# Patient Record
Sex: Female | Born: 1990 | Race: White | Hispanic: No | Marital: Single | Smoking: Current every day smoker
Health system: Southern US, Community
[De-identification: ages and names within clinical notes are randomized; demographics above are authoritative.]

## PROBLEM LIST (undated history)

## (undated) DIAGNOSIS — F1010 Alcohol abuse, uncomplicated: Secondary | ICD-10-CM

## (undated) DIAGNOSIS — N200 Calculus of kidney: Secondary | ICD-10-CM

## (undated) DIAGNOSIS — F329 Major depressive disorder, single episode, unspecified: Secondary | ICD-10-CM

## (undated) DIAGNOSIS — R45851 Suicidal ideations: Secondary | ICD-10-CM

## (undated) DIAGNOSIS — K5792 Diverticulitis of intestine, part unspecified, without perforation or abscess without bleeding: Secondary | ICD-10-CM

## (undated) DIAGNOSIS — F32A Depression, unspecified: Secondary | ICD-10-CM

## (undated) DIAGNOSIS — R4689 Other symptoms and signs involving appearance and behavior: Secondary | ICD-10-CM

## (undated) HISTORY — PX: WISDOM TOOTH EXTRACTION: SHX21

## (undated) HISTORY — PX: TONSILLECTOMY: SUR1361

---
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
## 2011-03-10 LAB — HEPATIC FUNCTION PANEL
ALT: 17 U/L (ref 0–35)
Alkaline Phosphatase: 48 U/L (ref 47–119)
Bilirubin, Direct: 0.1 mg/dL (ref 0.0–0.3)
Indirect Bilirubin: 0.4 mg/dL (ref 0.3–0.9)
Total Protein: 6.9 g/dL (ref 6.0–8.3)

## 2011-03-10 LAB — COMPREHENSIVE METABOLIC PANEL
BUN: 7 mg/dL (ref 6–23)
CO2: 27 mEq/L (ref 19–32)
Calcium: 9.2 mg/dL (ref 8.4–10.5)
Chloride: 102 mEq/L (ref 96–112)
Creatinine, Ser: 0.48 mg/dL (ref 0.4–1.2)
Total Bilirubin: 0.9 mg/dL (ref 0.3–1.2)

## 2011-03-10 LAB — DIFFERENTIAL
Lymphocytes Relative: 32 % (ref 24–48)
Lymphs Abs: 3.1 10*3/uL (ref 1.1–4.8)
Monocytes Absolute: 1.1 10*3/uL (ref 0.2–1.2)
Monocytes Relative: 12 % — ABNORMAL HIGH (ref 3–11)
Neutro Abs: 5.4 10*3/uL (ref 1.7–8.0)
Neutrophils Relative %: 56 % (ref 43–71)

## 2011-03-10 LAB — CBC
Hemoglobin: 12.4 g/dL (ref 12.0–16.0)
RBC: 3.86 MIL/uL (ref 3.80–5.70)
WBC: 9.6 10*3/uL (ref 4.5–13.5)

## 2011-03-10 LAB — T4, FREE: Free T4: 0.87 ng/dL (ref 0.80–1.80)

## 2011-03-10 LAB — RPR: RPR Ser Ql: NONREACTIVE

## 2011-03-10 LAB — GC/CHLAMYDIA PROBE AMP, URINE: Chlamydia, Swab/Urine, PCR: NEGATIVE
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
## 2011-05-10 ENCOUNTER — Emergency Department (HOSPITAL_COMMUNITY): Payer: Medicaid Other

## 2011-05-10 ENCOUNTER — Emergency Department (HOSPITAL_COMMUNITY)
Admission: EM | Admit: 2011-05-10 | Discharge: 2011-05-10 | Disposition: A | Discharge status: Home / Self Care | Payer: Medicaid Other | Attending: Emergency Medicine | Admitting: Emergency Medicine

## 2011-05-10 DIAGNOSIS — R109 Unspecified abdominal pain: Secondary | ICD-10-CM | POA: Insufficient documentation

## 2011-05-10 DIAGNOSIS — N200 Calculus of kidney: Secondary | ICD-10-CM | POA: Insufficient documentation

## 2011-05-10 LAB — URINALYSIS, ROUTINE W REFLEX MICROSCOPIC
Glucose, UA: NEGATIVE mg/dL
Leukocytes, UA: NEGATIVE
Protein, ur: NEGATIVE mg/dL
Specific Gravity, Urine: >1.03 — ABNORMAL HIGH (ref 1.005–1.030)
pH: 5.5 (ref 5.0–8.0)

## 2011-05-10 LAB — BASIC METABOLIC PANEL
BUN: 7 mg/dL (ref 6–23)
Creatinine, Ser: 0.47 mg/dL (ref 0.4–1.2)
GFR calc non Af Amer: >60 mL/min (ref >60)
Glucose, Bld: 90 mg/dL (ref 70–99)
Potassium: 3.8 mEq/L (ref 3.5–5.1)

## 2011-05-10 LAB — URINE MICROSCOPIC-ADD ON

## 2011-06-21 ENCOUNTER — Emergency Department: Payer: Self-pay | Admitting: Emergency Medicine

## 2011-07-29 ENCOUNTER — Emergency Department: Payer: Self-pay | Admitting: Emergency Medicine

## 2012-02-18 ENCOUNTER — Emergency Department: Payer: Self-pay | Admitting: Emergency Medicine

## 2012-02-18 LAB — CBC
HGB: 14 g/dL (ref 12.0–16.0)
RBC: 4.35 10*6/uL (ref 3.80–5.20)

## 2012-07-01 ENCOUNTER — Emergency Department: Payer: Self-pay | Admitting: Emergency Medicine

## 2012-07-01 LAB — CBC WITH DIFFERENTIAL/PLATELET
Basophil #: 0.1 10*3/uL (ref 0.0–0.1)
Eosinophil #: 0.1 10*3/uL (ref 0.0–0.7)
Lymphocyte %: 37.4 %
Monocyte %: 11.2 %
Neutrophil %: 50.2 %
Platelet: 416 10*3/uL (ref 150–440)
RBC: 4.06 10*6/uL (ref 3.80–5.20)
RDW: 12.7 % (ref 11.5–14.5)
WBC: 8.8 10*3/uL (ref 3.6–11.0)

## 2012-07-01 LAB — BASIC METABOLIC PANEL
Anion Gap: 5 — ABNORMAL LOW (ref 7–16)
Calcium, Total: 9.2 mg/dL (ref 8.5–10.1)
Co2: 26 mmol/L (ref 21–32)
Creatinine: 0.63 mg/dL (ref 0.60–1.30)
EGFR (African American): >60

## 2012-07-01 LAB — URINALYSIS, COMPLETE
Bacteria: NONE SEEN
Glucose,UR: NEGATIVE mg/dL (ref 0–75)
Nitrite: NEGATIVE
Specific Gravity: 1.014 (ref 1.003–1.030)
Squamous Epithelial: 7

## 2012-09-26 ENCOUNTER — Encounter (HOSPITAL_COMMUNITY): Payer: Self-pay | Admitting: *Deleted

## 2012-09-26 ENCOUNTER — Emergency Department (HOSPITAL_COMMUNITY)
Admission: EM | Admit: 2012-09-26 | Discharge: 2012-09-26 | Disposition: A | Discharge status: Home / Self Care | Payer: Medicaid Other | Attending: Emergency Medicine | Admitting: Emergency Medicine

## 2012-09-26 ENCOUNTER — Emergency Department (HOSPITAL_COMMUNITY): Payer: Medicaid Other

## 2012-09-26 DIAGNOSIS — J40 Bronchitis, not specified as acute or chronic: Secondary | ICD-10-CM

## 2012-09-26 DIAGNOSIS — J209 Acute bronchitis, unspecified: Secondary | ICD-10-CM | POA: Insufficient documentation

## 2012-09-26 DIAGNOSIS — F411 Generalized anxiety disorder: Secondary | ICD-10-CM | POA: Insufficient documentation

## 2012-09-26 DIAGNOSIS — F17200 Nicotine dependence, unspecified, uncomplicated: Secondary | ICD-10-CM | POA: Insufficient documentation

## 2012-09-26 DIAGNOSIS — Z9889 Other specified postprocedural states: Secondary | ICD-10-CM | POA: Insufficient documentation

## 2012-09-26 DIAGNOSIS — R000 Tachycardia, unspecified: Secondary | ICD-10-CM | POA: Insufficient documentation

## 2012-09-26 DIAGNOSIS — Z87442 Personal history of urinary calculi: Secondary | ICD-10-CM | POA: Insufficient documentation

## 2012-09-26 DIAGNOSIS — N23 Unspecified renal colic: Secondary | ICD-10-CM | POA: Insufficient documentation

## 2012-09-26 DIAGNOSIS — R110 Nausea: Secondary | ICD-10-CM | POA: Insufficient documentation

## 2012-09-26 HISTORY — DX: Calculus of kidney: N20.0

## 2012-09-26 LAB — URINALYSIS, ROUTINE W REFLEX MICROSCOPIC
Bilirubin Urine: NEGATIVE
Glucose, UA: NEGATIVE mg/dL
Nitrite: NEGATIVE
pH: 5.5 (ref 5.0–8.0)

## 2012-09-26 LAB — CBC WITH DIFFERENTIAL/PLATELET
Eosinophils Absolute: 0.1 10*3/uL (ref 0.0–0.7)
Eosinophils Relative: 0 % (ref 0–5)
Hemoglobin: 13.5 g/dL (ref 12.0–15.0)
Lymphs Abs: 4.5 10*3/uL — ABNORMAL HIGH (ref 0.7–4.0)
MCH: 30.6 pg (ref 26.0–34.0)
MCV: 88.2 fL (ref 78.0–100.0)
Monocytes Relative: 10 % (ref 3–12)
RBC: 4.41 MIL/uL (ref 3.87–5.11)

## 2012-09-26 LAB — WET PREP, GENITAL
Trich, Wet Prep: NONE SEEN
Yeast Wet Prep HPF POC: NONE SEEN

## 2012-09-26 LAB — BASIC METABOLIC PANEL
BUN: 11 mg/dL (ref 6–23)
Calcium: 10.2 mg/dL (ref 8.4–10.5)
GFR calc non Af Amer: >90 mL/min (ref >90)
Glucose, Bld: 83 mg/dL (ref 70–99)

## 2012-09-26 MED ORDER — OXYCODONE-ACETAMINOPHEN 5-325 MG PO TABS: 2.0000 | ORAL_TABLET | ORAL | Status: DC | PRN

## 2012-09-26 MED ORDER — HYDROMORPHONE HCL PF 1 MG/ML IJ SOLN
1.0000 mg | Freq: Once | INTRAMUSCULAR | Status: AC
  Administered 2012-09-26: 1 mg via INTRAVENOUS
  Filled 2012-09-26: qty 1

## 2012-09-26 MED ORDER — SODIUM CHLORIDE 0.9 % IV BOLUS (SEPSIS)
1000.0000 mL | Freq: Once | INTRAVENOUS | Status: AC
  Administered 2012-09-26: 1000 mL via INTRAVENOUS

## 2012-09-26 MED ORDER — ONDANSETRON HCL 4 MG PO TABS: 4.0000 mg | ORAL_TABLET | Freq: Four times a day (QID) | ORAL | Status: DC

## 2012-09-26 MED ORDER — KETOROLAC TROMETHAMINE 30 MG/ML IJ SOLN
30.0000 mg | Freq: Once | INTRAMUSCULAR | Status: AC
  Administered 2012-09-26: 30 mg via INTRAVENOUS
  Filled 2012-09-26: qty 1

## 2012-09-26 MED ORDER — ALBUTEROL SULFATE HFA 108 (90 BASE) MCG/ACT IN AERS: 2.0000 | INHALATION_SPRAY | RESPIRATORY_TRACT | Status: DC | PRN

## 2012-09-26 MED ORDER — ONDANSETRON HCL 4 MG/2ML IJ SOLN
4.0000 mg | Freq: Once | INTRAMUSCULAR | Status: AC
  Administered 2012-09-26: 4 mg via INTRAVENOUS
  Filled 2012-09-26: qty 2

## 2012-09-26 MED ORDER — IBUPROFEN 800 MG PO TABS: 800.0000 mg | ORAL_TABLET | Freq: Three times a day (TID) | ORAL | Status: DC

## 2012-09-26 MED ORDER — AZITHROMYCIN 250 MG PO TABS: ORAL_TABLET | ORAL | Status: DC

## 2012-09-26 NOTE — ED Notes (Addendum)
MD at bedside to perform pelvic escorted by female tech

## 2012-09-26 NOTE — ED Provider Notes (Addendum)
History     CSN: [id number]  Arrival date & time 09/26/12  0303   First MD Initiated Contact with Patient 09/26/12 0325      Chief Complaint  Patient presents with  . Flank Pain    Rt side, HX of kidney stones  . Nausea  . Cough    (Consider location/radiation/quality/duration/timing/severity/associated sxs/prior treatment) HPI Comments: Patient presents with multiple complaints. She has right-sided low back pain it radiates to her flank and groin similar to previous kidney stones. Associated with nausea.. She denies any fever, vomiting, chest pain or shortness of breath. She denies any vaginal discharge. She is currently on her menstrual period. She's not had previous manipulation of kidney stones. She also endorses a dry cough that she's had for greater than a month. She is an active smoker. She also complains of a sore throat and pain with swallowing.  The history is provided by the patient.    Past Medical History  Diagnosis Date  . Kidney stone     Past Surgical History  Procedure Date  . Tonsillectomy     History reviewed. No pertinent family history.  History  Substance Use Topics  . Smoking status: Current Every Day Smoker -- 1.0 packs/day    Types: Cigarettes  . Smokeless tobacco: Not on file  . Alcohol Use: No    OB History    Grav Para Term Preterm Abortions TAB SAB Ect Mult Living                  Review of Systems  Constitutional: Negative for fever, activity change and appetite change.  HENT: Positive for sore throat. Negative for congestion, rhinorrhea and trouble swallowing.   Respiratory: Positive for cough.   Cardiovascular: Negative for chest pain.  Gastrointestinal: Positive for nausea. Negative for vomiting and abdominal pain.  Genitourinary: Positive for flank pain and vaginal bleeding. Negative for dysuria.  Musculoskeletal: Positive for back pain.  Skin: Negative for rash.  Neurological: Negative for dizziness and headaches.     Allergies  Amoxicillin  Home Medications   Current Outpatient Rx  Name Route Sig Dispense Refill  . AMPHETAMINE-DEXTROAMPHET ER 20 MG PO CP24 Oral Take 20 mg by mouth every morning.      BP 126/78  Pulse 120  Temp 98.4 F (36.9 C) (Oral)  Resp 18  Ht 5\' 5"  (1.651 m)  Wt 115 lb (52.164 kg)  BMI 19.14 kg/m2  SpO2 97%  LMP 09/26/2012  Physical Exam  Constitutional: She is oriented to person, place, and time. She appears well-developed and well-nourished. No distress.       anxious  HENT:  Head: Normocephalic and atraumatic.  Mouth/Throat: Oropharynx is clear and moist. No oropharyngeal exudate.  Eyes: Conjunctivae normal and EOM are normal. Pupils are equal, round, and reactive to light.  Neck: Normal range of motion. Neck supple.  Cardiovascular: Normal rate, regular rhythm and normal heart sounds.   Pulmonary/Chest: Breath sounds normal. No respiratory distress.  Abdominal: Soft. There is no tenderness. There is no rebound and no guarding.  Genitourinary: There is no tenderness on the right labia. There is no tenderness on the left labia. Cervix exhibits no motion tenderness. Right adnexum displays no mass and no tenderness. Left adnexum displays no mass and no tenderness. No vaginal discharge found.       Dark blood in vaginal vault, no active bleeding. Chaperone was present during exam.   Musculoskeletal: Normal range of motion. She exhibits tenderness. She exhibits no edema.  R CVAT  Neurological: She is alert and oriented to person, place, and time. No cranial nerve deficit.  Skin: Skin is warm.    ED Course  Procedures (including critical care time)  Labs Reviewed  URINALYSIS, ROUTINE W REFLEX MICROSCOPIC - Abnormal; Notable for the following:    Hgb urine dipstick LARGE (*)     Ketones, ur TRACE (*)     All other components within normal limits  CBC WITH DIFFERENTIAL - Abnormal; Notable for the following:    WBC 13.8 (*)     Platelets 481 (*)      Neutro Abs 7.8 (*)     Lymphs Abs 4.5 (*)     Monocytes Absolute 1.4 (*)     All other components within normal limits  BASIC METABOLIC PANEL - Abnormal; Notable for the following:    Potassium 3.4 (*)     Creatinine, Ser 0.48 (*)     All other components within normal limits  URINE MICROSCOPIC-ADD ON - Abnormal; Notable for the following:    Squamous Epithelial / LPF MANY (*)     All other components within normal limits  PREGNANCY, URINE  RAPID STREP SCREEN  GC/CHLAMYDIA PROBE AMP, GENITAL  WET PREP, GENITAL   Ct Abdomen Pelvis Wo Contrast  09/26/2012  *RADIOLOGY REPORT*  Clinical Data: Right flank pain  CT ABDOMEN AND PELVIS WITHOUT CONTRAST  Technique:  Multidetector CT imaging of the abdomen and pelvis was performed following the standard protocol without intravenous contrast.  Comparison: 05/10/2011  Findings: Limited images through the lung bases demonstrate no significant appreciable abnormality. The heart size is within normal limits. No pleural or pericardial effusion.  Organ abnormality/lesion detection is limited in the absence of intravenous contrast. Within this limitation, unremarkable liver, spleen, pancreas, adrenal glands.  There is concentrated bile versus noncalcified gallstones.  No biliary ductal dilatation.  There are a few tiny nonobstructing renal stones bilaterally, left greater than right.  1 cm right renal simple cyst.  No hydronephrosis or hydroureter. There are a couple punctate calcifications within the pelvis, including a 2 mm calcification on the right as seen on series 2 image 69.  No bowel obstruction.  No CT evidence for colitis.  Normal appendix.  No free intraperitoneal air or fluid.  No lymphadenopathy.  Normal caliber vasculature.  Partially decompressed bladder.  Unremarkable CT appearance to the uterus and adnexa.  No acute osseous finding.  IMPRESSION: There are nonobstructing renal stones bilaterally.  No hydronephrosis or hydroureter. Therefore, unable  to follow the decompressed ureters in their entirety.  There is a 2 mm calcification within the pelvis and a nonobstructive distal right ureteral stone is not excluded.   Original Report Authenticated By: Waneta Martins, M.D.    Dg Chest 2 View  09/26/2012  *RADIOLOGY REPORT*  Clinical Data: Productive cough, chest tightness.  CHEST - 2 VIEW  Comparison: None  Findings: Interstitial prominence.  There is no confluent airspace opacity, pleural effusion, or pneumothorax. Cardiomediastinal contours are within normal limits.  No acute osseous finding.  IMPRESSION: Mild interstitial prominence, may reflect sequelae of smoking or atypical infection.  No confluent airspace opacity.   Original Report Authenticated By: Waneta Martins, M.D.      No diagnosis found.    MDM  Right flank pain associated with nausea and dysuria. History of kidney stones. Abdomen soft and nontender. No peritoneal signs. Hematuria but patient on period.  Tachycardic and anxious.   Pelvic exam benign.  HCG negative.  Possible  2 mm distal stone on R. UA not infected. CXR negative for PNA.  Treat for bronchitis given smoking history and productive cough. HR improved with IVF.  No chest pain or SOB. D-dimer negative. Tolerating PO.       Glynn Octave, MD 09/26/12 1421  Glynn Octave, MD 09/26/12 [phone]

## 2012-09-26 NOTE — ED Notes (Signed)
Discharge instructions reviewed with pt, questions answered. Pt verbalized understanding.  

## 2012-09-26 NOTE — ED Notes (Signed)
Pt has hx of kidney stones, rt flank pain started x2days with nausea, burning on urination.

## 2012-09-27 LAB — GC/CHLAMYDIA PROBE AMP, GENITAL: GC Probe Amp, Genital: NEGATIVE

## 2012-10-06 ENCOUNTER — Encounter (HOSPITAL_COMMUNITY): Payer: Self-pay | Admitting: Emergency Medicine

## 2012-10-06 ENCOUNTER — Emergency Department (HOSPITAL_COMMUNITY): Payer: Self-pay

## 2012-10-06 ENCOUNTER — Emergency Department (HOSPITAL_COMMUNITY)
Admission: EM | Admit: 2012-10-06 | Discharge: 2012-10-07 | Disposition: A | Discharge status: Home / Self Care | Payer: Self-pay | Attending: Emergency Medicine | Admitting: Emergency Medicine

## 2012-10-06 DIAGNOSIS — F17200 Nicotine dependence, unspecified, uncomplicated: Secondary | ICD-10-CM | POA: Insufficient documentation

## 2012-10-06 DIAGNOSIS — Z79899 Other long term (current) drug therapy: Secondary | ICD-10-CM | POA: Insufficient documentation

## 2012-10-06 DIAGNOSIS — N200 Calculus of kidney: Secondary | ICD-10-CM | POA: Insufficient documentation

## 2012-10-06 DIAGNOSIS — N23 Unspecified renal colic: Secondary | ICD-10-CM | POA: Insufficient documentation

## 2012-10-06 MED ORDER — KETOROLAC TROMETHAMINE 60 MG/2ML IM SOLN
60.0000 mg | Freq: Once | INTRAMUSCULAR | Status: AC
  Administered 2012-10-06: 60 mg via INTRAMUSCULAR
  Filled 2012-10-06: qty 2

## 2012-10-06 MED ORDER — OXYCODONE-ACETAMINOPHEN 5-325 MG PO TABS
1.0000 | ORAL_TABLET | Freq: Once | ORAL | Status: AC
  Administered 2012-10-06: 1 via ORAL
  Filled 2012-10-06: qty 1

## 2012-10-06 NOTE — ED Notes (Signed)
PT. REPORTS BILATERAL FLANK PAIN MORE ON THE RIGHT SIDE WITH HEMATURIA AND DYSURIA ONSET YESTERDAY , DIAGNOSED WITH KIDNEY STONE 2 WEEKS AGO AT [facility] PRESCRIBED WITH ANTIBIOTIC AND REFERRAL TO UROLOGIST.

## 2012-10-06 NOTE — ED Notes (Signed)
Advised patient we need a urine sample. 

## 2012-10-06 NOTE — ED Notes (Signed)
UNABLE TO GIVE URINE SPECIMEN AT THIS TIME .  

## 2012-10-07 LAB — URINE MICROSCOPIC-ADD ON

## 2012-10-07 LAB — POCT I-STAT, CHEM 8
BUN: 6 mg/dL (ref 6–23)
Calcium, Ion: 1.22 mmol/L (ref 1.12–1.23)
Chloride: 104 mEq/L (ref 96–112)
Glucose, Bld: 89 mg/dL (ref 70–99)

## 2012-10-07 LAB — URINALYSIS, ROUTINE W REFLEX MICROSCOPIC
Bilirubin Urine: NEGATIVE
Glucose, UA: NEGATIVE mg/dL
Ketones, ur: NEGATIVE mg/dL
pH: 7.5 (ref 5.0–8.0)

## 2012-10-07 LAB — POCT PREGNANCY, URINE: Preg Test, Ur: NEGATIVE

## 2012-10-07 MED ORDER — ONDANSETRON HCL 4 MG PO TABS: 4.0000 mg | ORAL_TABLET | Freq: Four times a day (QID) | ORAL | Status: DC

## 2012-10-07 MED ORDER — OXYCODONE-ACETAMINOPHEN 5-325 MG PO TABS: 1.0000 | ORAL_TABLET | ORAL | Status: DC | PRN

## 2012-10-07 NOTE — ED Provider Notes (Signed)
History     CSN: [id number]  Arrival date & time 10/06/12  2021   First MD Initiated Contact with Patient 10/06/12 2318      Chief Complaint  Patient presents with  . Flank Pain    (Consider location/radiation/quality/duration/timing/severity/associated sxs/prior treatment) HPI Pt seen in [facility] ED and diagnosed with 8 bl renal stones. Given pain meds and told to F/u with Urology. Pt began having R flank pain yesterday radiating to abd. +difficulty urinating and hematuria. No fever or chills. No vaginal complaints. States pain feels like previous renal colic.  Past Medical History  Diagnosis Date  . Kidney stone     Past Surgical History  Procedure Date  . Tonsillectomy     No family history on file.  History  Substance Use Topics  . Smoking status: Current Every Day Smoker -- 1.0 packs/day    Types: Cigarettes  . Smokeless tobacco: Not on file  . Alcohol Use: No    OB History    Grav Para Term Preterm Abortions TAB SAB Ect Mult Living                  Review of Systems  Constitutional: Negative for fever and chills.  Respiratory: Negative for shortness of breath.   Cardiovascular: Negative for chest pain.  Gastrointestinal: Negative for nausea, vomiting, abdominal pain and constipation.  Genitourinary: Positive for hematuria, flank pain and difficulty urinating. Negative for dysuria, vaginal bleeding, vaginal discharge and vaginal pain.  Musculoskeletal: Positive for back pain.  Skin: Negative for rash and wound.  Neurological: Negative for dizziness, weakness, light-headedness and numbness.    Allergies  Latex and Amoxicillin  Home Medications   Current Outpatient Rx  Name  Route  Sig  Dispense  Refill  . ACETAMINOPHEN 500 MG PO TABS   Oral   Take 1,000-1,500 mg by mouth every 6 (six) hours as needed. For pain         . ALBUTEROL SULFATE HFA 108 (90 BASE) MCG/ACT IN AERS   Inhalation   Inhale 2 puffs into the lungs every 4 (four) hours as  needed for wheezing.   1 Inhaler   0   . AMPHETAMINE-DEXTROAMPHET ER 20 MG PO CP24   Oral   Take 20 mg by mouth every morning.         . ASPIRIN EC 81 MG PO TBEC   Oral   Take 81 mg by mouth daily.         . IBUPROFEN 800 MG PO TABS   Oral   Take 1 tablet (800 mg total) by mouth 3 (three) times daily.   21 tablet   0   . ONDANSETRON HCL 4 MG PO TABS   Oral   Take 1 tablet (4 mg total) by mouth every 6 (six) hours.   12 tablet   0   . OXYCODONE-ACETAMINOPHEN 5-325 MG PO TABS   Oral   Take 1 tablet by mouth every 4 (four) hours as needed for pain.   15 tablet   0     BP 116/79  Pulse 91  Temp 97.8 F (36.6 C) (Oral)  Resp 20  SpO2 100%  LMP 09/26/2012  Physical Exam  Nursing note and vitals reviewed. Constitutional: She is oriented to person, place, and time. She appears well-developed and well-nourished. No distress.  HENT:  Head: Normocephalic and atraumatic.  Mouth/Throat: Oropharynx is clear and moist.  Eyes: EOM are normal. Pupils are equal, round, and reactive to light.  Neck: Normal range of motion. Neck supple.  Cardiovascular: Normal rate and regular rhythm.   Pulmonary/Chest: Effort normal and breath sounds normal. No respiratory distress. She has no wheezes. She has no rales.  Abdominal: Soft. Bowel sounds are normal. She exhibits no mass. There is no tenderness. There is no rebound and no guarding.  Musculoskeletal: Normal range of motion. She exhibits no edema and no tenderness.       No flank tenderness to percussion  Neurological: She is alert and oriented to person, place, and time.  Skin: Skin is warm and dry. No rash noted. No erythema.  Psychiatric:       Anxious appearing    ED Course  Procedures (including critical care time)  Labs Reviewed  URINALYSIS, ROUTINE W REFLEX MICROSCOPIC - Abnormal; Notable for the following:    APPearance CLOUDY (*)     Hgb urine dipstick TRACE (*)     All other components within normal limits    URINE MICROSCOPIC-ADD ON - Abnormal; Notable for the following:    Squamous Epithelial / LPF FEW (*)     Bacteria, UA FEW (*)     All other components within normal limits  POCT I-STAT, CHEM 8  POCT PREGNANCY, URINE   US Renal  10/07/2012  *RADIOLOGY REPORT*  Clinical Data: Right flank pain.  History of urinary tract calculi.  RENAL/URINARY TRACT ULTRASOUND COMPLETE  Comparison:  CT abdomen and pelvis 09/21/2012, 05/10/2011.  Findings:  Right Kidney:  No hydronephrosis.  Well-preserved cortex.  No shadowing calculi.  Normal size and parenchymal echotexture without focal abnormalities.  Approximately 11.6 cm in length.  Left Kidney:  No hydronephrosis.  Well-preserved cortex.  No shadowing calculi.  Normal size and parenchymal echotexture without focal abnormalities.  The previously identified tiny left renal calculi are not visible by ultrasound.  Approximately 11.7 cm in length.  Bladder:  Decompressed and normal in appearance.  IMPRESSION: Normal urinary tract ultrasound.  Specifically, no evidence of hydronephrosis involving either kidney to suggest obstruction.   Original Report Authenticated By: Hulan Saas, M.D.      1. Renal colic on right side       MDM  Pt feeling much better at this point. Educated about when to return to the ED.         Loren Racer, MD 10/07/12 [phone]

## 2012-10-07 NOTE — ED Notes (Signed)
The pt is a diabetic and he reports he has had an infected lt great toe for 1-2 months.    He saw a specialist and told him he may have a bone infection.  Lt foot swollen and painful.

## 2013-07-31 ENCOUNTER — Encounter (HOSPITAL_COMMUNITY): Payer: Self-pay

## 2013-07-31 ENCOUNTER — Emergency Department (HOSPITAL_COMMUNITY)
Admission: EM | Admit: 2013-07-31 | Discharge: 2013-07-31 | Disposition: A | Discharge status: Home / Self Care | Payer: Medicaid Other | Attending: Emergency Medicine | Admitting: Emergency Medicine

## 2013-07-31 DIAGNOSIS — N390 Urinary tract infection, site not specified: Secondary | ICD-10-CM

## 2013-07-31 DIAGNOSIS — Z79899 Other long term (current) drug therapy: Secondary | ICD-10-CM | POA: Insufficient documentation

## 2013-07-31 DIAGNOSIS — Z7982 Long term (current) use of aspirin: Secondary | ICD-10-CM | POA: Insufficient documentation

## 2013-07-31 DIAGNOSIS — F17200 Nicotine dependence, unspecified, uncomplicated: Secondary | ICD-10-CM | POA: Insufficient documentation

## 2013-07-31 DIAGNOSIS — Z3202 Encounter for pregnancy test, result negative: Secondary | ICD-10-CM | POA: Insufficient documentation

## 2013-07-31 DIAGNOSIS — Z791 Long term (current) use of non-steroidal anti-inflammatories (NSAID): Secondary | ICD-10-CM | POA: Insufficient documentation

## 2013-07-31 DIAGNOSIS — Z91040 Latex allergy status: Secondary | ICD-10-CM | POA: Insufficient documentation

## 2013-07-31 DIAGNOSIS — Z87442 Personal history of urinary calculi: Secondary | ICD-10-CM | POA: Insufficient documentation

## 2013-07-31 LAB — URINALYSIS, ROUTINE W REFLEX MICROSCOPIC
Bilirubin Urine: NEGATIVE
Glucose, UA: NEGATIVE mg/dL
Ketones, ur: NEGATIVE mg/dL
Leukocytes, UA: NEGATIVE
Nitrite: NEGATIVE
Protein, ur: NEGATIVE mg/dL
Specific Gravity, Urine: 1.025 (ref 1.005–1.030)
Urobilinogen, UA: 0.2 mg/dL (ref 0.0–1.0)
pH: 6.5 (ref 5.0–8.0)

## 2013-07-31 LAB — URINE MICROSCOPIC-ADD ON

## 2013-07-31 LAB — PREGNANCY, URINE: Preg Test, Ur: NEGATIVE

## 2013-07-31 MED ORDER — ONDANSETRON HCL 4 MG PO TABS: 4.0000 mg | ORAL_TABLET | Freq: Four times a day (QID) | ORAL | Status: DC | PRN

## 2013-07-31 MED ORDER — IBUPROFEN 400 MG PO TABS: 400.0000 mg | ORAL_TABLET | Freq: Four times a day (QID) | ORAL | Status: DC | PRN

## 2013-07-31 MED ORDER — IBUPROFEN 400 MG PO TABS
400.0000 mg | ORAL_TABLET | Freq: Once | ORAL | Status: AC
  Administered 2013-07-31: 400 mg via ORAL
  Filled 2013-07-31: qty 1

## 2013-07-31 MED ORDER — CEPHALEXIN 500 MG PO CAPS
1000.0000 mg | ORAL_CAPSULE | Freq: Once | ORAL | Status: AC
  Administered 2013-07-31: 1000 mg via ORAL
  Filled 2013-07-31: qty 2

## 2013-07-31 MED ORDER — PHENAZOPYRIDINE HCL 100 MG PO TABS
200.0000 mg | ORAL_TABLET | Freq: Once | ORAL | Status: AC
  Administered 2013-07-31: 200 mg via ORAL
  Filled 2013-07-31: qty 2

## 2013-07-31 MED ORDER — PHENAZOPYRIDINE HCL 200 MG PO TABS: 200.0000 mg | ORAL_TABLET | Freq: Three times a day (TID) | ORAL | Status: DC | PRN

## 2013-07-31 MED ORDER — OXYCODONE-ACETAMINOPHEN 5-325 MG PO TABS: 1.0000 | ORAL_TABLET | ORAL | Status: DC | PRN

## 2013-07-31 MED ORDER — ONDANSETRON 8 MG PO TBDP
8.0000 mg | ORAL_TABLET | Freq: Once | ORAL | Status: AC
  Administered 2013-07-31: 8 mg via ORAL
  Filled 2013-07-31: qty 1

## 2013-07-31 MED ORDER — CEPHALEXIN 500 MG PO CAPS: 500.0000 mg | ORAL_CAPSULE | Freq: Four times a day (QID) | ORAL | Status: DC

## 2013-07-31 NOTE — ED Provider Notes (Signed)
CSN: [id number]     Arrival date & time 07/31/13  0021 History   First MD Initiated Contact with Patient 07/31/13 0041     Chief Complaint  Patient presents with  . uti symptoms   . Flank Pain   (Consider location/radiation/quality/duration/timing/severity/associated sxs/prior Treatment) Patient is a 21 y.o. female presenting with flank pain. The history is provided by the patient.  Flank Pain  She has been having dysuria for the last week. She denies urinary urgency or frequency but has had urinary tenesmus. She denies fever, chills, sweats. This evening, she developed right flank pain with associated nausea. Pain is moderately severe and she rates it at 7/10. Nothing makes it better and nothing makes it worse. She took acetaminophen and BC powder with no relief. She relates she does have a history of kidney stones. She has a contraceptive implant.  Past Medical History  Diagnosis Date  . Kidney stone    Past Surgical History  Procedure Laterality Date  . Tonsillectomy     No family history on file. History  Substance Use Topics  . Smoking status: Current Every Day Smoker -- 1.00 packs/day    Types: Cigarettes  . Smokeless tobacco: Not on file  . Alcohol Use: No   OB History   Grav Para Term Preterm Abortions TAB SAB Ect Mult Living                 Review of Systems  Genitourinary: Positive for flank pain.  All other systems reviewed and are negative.    Allergies  Latex and Amoxicillin  Home Medications   Current Outpatient Rx  Name  Route  Sig  Dispense  Refill  . acetaminophen (TYLENOL) 500 MG tablet   Oral   Take 1,000-1,500 mg by mouth every 6 (six) hours as needed. For pain         . amphetamine-dextroamphetamine (ADDERALL XR) 20 MG 24 hr capsule   Oral   Take 20 mg by mouth every morning.         Marland Kitchen aspirin EC 81 MG tablet   Oral   Take 81 mg by mouth daily.         Marland Kitchen albuterol (PROVENTIL HFA;VENTOLIN HFA) 108 (90 BASE) MCG/ACT inhaler  Inhalation   Inhale 2 puffs into the lungs every 4 (four) hours as needed for wheezing.   1 Inhaler   0   . ibuprofen (ADVIL,MOTRIN) 800 MG tablet   Oral   Take 1 tablet (800 mg total) by mouth 3 (three) times daily.   21 tablet   0   . ondansetron (ZOFRAN) 4 MG tablet   Oral   Take 1 tablet (4 mg total) by mouth every 6 (six) hours.   12 tablet   0   . oxyCODONE-acetaminophen (PERCOCET/ROXICET) 5-325 MG per tablet   Oral   Take 1 tablet by mouth every 4 (four) hours as needed for pain.   15 tablet   0    BP 128/82  Temp(Src) 98.2 F (36.8 C) (Oral)  Resp 18  Ht 5\' 5"  (1.651 m)  Wt 118 lb (53.524 kg)  BMI 19.64 kg/m2  SpO2 98% Physical Exam  Nursing note and vitals reviewed.  22 year old female, resting comfortably and in no acute distress. Vital signs are normal. Oxygen saturation is 98%, which is normal. Head is normocephalic and atraumatic. PERRLA, EOMI. Oropharynx is clear. Neck is nontender and supple without adenopathy or JVD. Back is nontender in the midline. There  is mild right CVA tenderness. Lungs are clear without rales, wheezes, or rhonchi. Chest is nontender. Heart has regular rate and rhythm without murmur. Abdomen is soft, flat, nontender without masses or hepatosplenomegaly and peristalsis is normoactive. Extremities have no cyanosis or edema, full range of motion is present. Skin is warm and dry without rash. Neurologic: Mental status is normal, cranial nerves are intact, there are no motor or sensory deficits.  ED Course  Procedures (including critical care time) Labs Review Results for orders placed during the hospital encounter of 07/31/13  URINALYSIS, ROUTINE W REFLEX MICROSCOPIC      Result Value Range   Color, Urine YELLOW  YELLOW   APPearance CLEAR  CLEAR   Specific Gravity, Urine 1.025  1.005 - 1.030   pH 6.5  5.0 - 8.0   Glucose, UA NEGATIVE  NEGATIVE mg/dL   Hgb urine dipstick MODERATE (*) NEGATIVE   Bilirubin Urine NEGATIVE   NEGATIVE   Ketones, ur NEGATIVE  NEGATIVE mg/dL   Protein, ur NEGATIVE  NEGATIVE mg/dL   Urobilinogen, UA 0.2  0.0 - 1.0 mg/dL   Nitrite NEGATIVE  NEGATIVE   Leukocytes, UA NEGATIVE  NEGATIVE  PREGNANCY, URINE      Result Value Range   Preg Test, Ur NEGATIVE  NEGATIVE  URINE MICROSCOPIC-ADD ON      Result Value Range   Squamous Epithelial / LPF MANY (*) RARE   WBC, UA 0-2  <3 WBC/hpf   RBC / HPF 11-20  <3 RBC/hpf   Bacteria, UA MANY (*) RARE   Crystals CA OXALATE CRYSTALS (*) NEGATIVE   Urine-Other MUCOUS PRESENT      MDM   1. Urinary tract infection    Flank pain and dysuria which most likely represent a urinary tract infection. Old records are reviewed and she has ED visits with report of kidney stones. However, I have reviewed her CT scan and she had very small intrarenal stones and no ureteral stones. Symptoms are predominantly in the left kidney site do not believe that her pain tonight is likely to be due to a kidney stone. Urinalysis will be checked and she's given a dose of ondansetron for nausea and a dose of ibuprofen.  She got considerable relief of pain with ibuprofen. Urinalysis confirms urinary tract infection. Crystal urea is noted which is consistent with her known history of nephrolithiasis. She is discharged with prescriptions for cephalexin, [place]. HEENT, ondansetron, ibuprofen. She's also given a to go pack of oxycodone-acetaminophen-acetaminophen.  Dione Booze, MD 07/31/13 [phone]

## 2013-07-31 NOTE — ED Notes (Signed)
Pt states she has been having burning with urination for some time, tonight with worsening pain in lower back and in sides

## 2013-08-10 MED FILL — Oxycodone w/ Acetaminophen Tab 5-325 MG: ORAL | Qty: 6 | Status: AC

## 2013-09-27 ENCOUNTER — Emergency Department: Payer: Self-pay | Admitting: Emergency Medicine

## 2013-09-27 LAB — COMPREHENSIVE METABOLIC PANEL
Albumin: 4.4 g/dL (ref 3.4–5.0)
Alkaline Phosphatase: 91 U/L (ref 50–136)
Calcium, Total: 9.4 mg/dL (ref 8.5–10.1)
Creatinine: 0.67 mg/dL (ref 0.60–1.30)
EGFR (African American): >60
EGFR (Non-African Amer.): >60
Glucose: 100 mg/dL — ABNORMAL HIGH (ref 65–99)
Osmolality: 275 (ref 275–301)
Sodium: 138 mmol/L (ref 136–145)

## 2013-09-27 LAB — TSH: Thyroid Stimulating Horm: 1.88 u[IU]/mL

## 2013-09-27 LAB — DRUG SCREEN, URINE
Amphetamines, Ur Screen: NEGATIVE (ref ?–1000)
Barbiturates, Ur Screen: NEGATIVE (ref ?–200)
Benzodiazepine, Ur Scrn: POSITIVE (ref ?–200)
Cannabinoid 50 Ng, Ur ~~LOC~~: POSITIVE (ref ?–50)
Cocaine Metabolite,Ur ~~LOC~~: NEGATIVE (ref ?–300)
Methadone, Ur Screen: NEGATIVE (ref ?–300)
Opiate, Ur Screen: NEGATIVE (ref ?–300)

## 2013-09-27 LAB — URINALYSIS, COMPLETE
Bilirubin,UR: NEGATIVE
Ph: 6 (ref 4.5–8.0)
Protein: NEGATIVE
RBC,UR: 5 /HPF (ref 0–5)

## 2013-09-27 LAB — CBC
HCT: 40.8 % (ref 35.0–47.0)
HGB: 14.1 g/dL (ref 12.0–16.0)
MCH: 32.6 pg (ref 26.0–34.0)
MCV: 95 fL (ref 80–100)
RDW: 13.4 % (ref 11.5–14.5)

## 2013-09-27 LAB — PREGNANCY, URINE: Pregnancy Test, Urine: NEGATIVE m[IU]/mL

## 2014-04-16 ENCOUNTER — Emergency Department: Payer: Self-pay | Admitting: Emergency Medicine

## 2014-04-16 LAB — CBC
HCT: 39.6 % (ref 35.0–47.0)
HGB: 13.2 g/dL (ref 12.0–16.0)
MCH: 31.9 pg (ref 26.0–34.0)
MCHC: 33.5 g/dL (ref 32.0–36.0)
MCV: 95 fL (ref 80–100)
PLATELETS: 469 10*3/uL — AB (ref 150–440)
RBC: 4.15 10*6/uL (ref 3.80–5.20)
RDW: 12.6 % (ref 11.5–14.5)
WBC: 11.3 10*3/uL — ABNORMAL HIGH (ref 3.6–11.0)

## 2014-04-16 LAB — COMPREHENSIVE METABOLIC PANEL
ALK PHOS: 60 U/L
ALT: 12 U/L (ref 12–78)
AST: 23 U/L (ref 15–37)
Albumin: 4.2 g/dL (ref 3.4–5.0)
Anion Gap: 8 (ref 7–16)
BUN: 8 mg/dL (ref 7–18)
Bilirubin,Total: 0.1 mg/dL — ABNORMAL LOW (ref 0.2–1.0)
CALCIUM: 9 mg/dL (ref 8.5–10.1)
CREATININE: 0.5 mg/dL — AB (ref 0.60–1.30)
Chloride: 112 mmol/L — ABNORMAL HIGH (ref 98–107)
Co2: 24 mmol/L (ref 21–32)
EGFR (African American): >60
Glucose: 94 mg/dL (ref 65–99)
Osmolality: 285 (ref 275–301)
POTASSIUM: 3.5 mmol/L (ref 3.5–5.1)
Sodium: 144 mmol/L (ref 136–145)
Total Protein: 7.5 g/dL (ref 6.4–8.2)

## 2014-04-16 LAB — DRUG SCREEN, URINE
AMPHETAMINES, UR SCREEN: NEGATIVE (ref ?–1000)
BARBITURATES, UR SCREEN: NEGATIVE (ref ?–200)
BENZODIAZEPINE, UR SCRN: POSITIVE (ref ?–200)
COCAINE METABOLITE, UR ~~LOC~~: NEGATIVE (ref ?–300)
Cannabinoid 50 Ng, Ur ~~LOC~~: NEGATIVE (ref ?–50)
MDMA (ECSTASY) UR SCREEN: NEGATIVE (ref ?–500)
Methadone, Ur Screen: NEGATIVE (ref ?–300)
OPIATE, UR SCREEN: NEGATIVE (ref ?–300)
Phencyclidine (PCP) Ur S: NEGATIVE (ref ?–25)
Tricyclic, Ur Screen: NEGATIVE (ref ?–1000)

## 2014-04-16 LAB — ETHANOL
ETHANOL %: 0.208 % — AB (ref 0.000–0.080)
Ethanol: 208 mg/dL

## 2014-04-16 LAB — TSH: THYROID STIMULATING HORM: 1.45 u[IU]/mL

## 2014-04-16 LAB — ACETAMINOPHEN LEVEL

## 2014-04-16 LAB — SALICYLATE LEVEL: Salicylates, Serum: 3.6 mg/dL — ABNORMAL HIGH

## 2014-07-29 ENCOUNTER — Emergency Department: Payer: Self-pay | Admitting: Emergency Medicine

## 2014-07-29 LAB — WET PREP, GENITAL

## 2014-07-29 LAB — URINALYSIS, COMPLETE
BILIRUBIN, UR: NEGATIVE
Glucose,UR: NEGATIVE mg/dL (ref 0–75)
KETONE: NEGATIVE
Nitrite: NEGATIVE
PH: 6 (ref 4.5–8.0)
PROTEIN: NEGATIVE
RBC,UR: 6 /HPF (ref 0–5)
Specific Gravity: 1.02 (ref 1.003–1.030)
Squamous Epithelial: 1
WBC UR: 8 /HPF (ref 0–5)

## 2014-07-29 LAB — BASIC METABOLIC PANEL
Anion Gap: 6 — ABNORMAL LOW (ref 7–16)
BUN: 9 mg/dL (ref 7–18)
Calcium, Total: 8.3 mg/dL — ABNORMAL LOW (ref 8.5–10.1)
Chloride: 110 mmol/L — ABNORMAL HIGH (ref 98–107)
Co2: 27 mmol/L (ref 21–32)
Creatinine: 0.65 mg/dL (ref 0.60–1.30)
EGFR (African American): >60
Glucose: 67 mg/dL (ref 65–99)
Osmolality: 282 (ref 275–301)
POTASSIUM: 3.7 mmol/L (ref 3.5–5.1)
SODIUM: 143 mmol/L (ref 136–145)

## 2014-07-29 LAB — CBC
HCT: 41.5 % (ref 35.0–47.0)
HGB: 14 g/dL (ref 12.0–16.0)
MCH: 32.2 pg (ref 26.0–34.0)
MCHC: 33.6 g/dL (ref 32.0–36.0)
MCV: 96 fL (ref 80–100)
Platelet: 431 10*3/uL (ref 150–440)
RBC: 4.34 10*6/uL (ref 3.80–5.20)
RDW: 13.1 % (ref 11.5–14.5)
WBC: 12.8 10*3/uL — ABNORMAL HIGH (ref 3.6–11.0)

## 2014-07-29 LAB — CBC WITH DIFFERENTIAL/PLATELET
Basophil #: 0.1 10*3/uL (ref 0.0–0.1)
Basophil %: 0.8 %
Eosinophil #: 0.1 10*3/uL (ref 0.0–0.7)
Eosinophil %: 0.9 %
LYMPHS PCT: 25.7 %
Lymphocyte #: 3.3 10*3/uL (ref 1.0–3.6)
MONOS PCT: 9.8 %
Monocyte #: 1.3 x10 3/mm — ABNORMAL HIGH (ref 0.2–0.9)
NEUTROS PCT: 62.8 %
Neutrophil #: 8 10*3/uL — ABNORMAL HIGH (ref 1.4–6.5)

## 2014-07-29 LAB — MONONUCLEOSIS SCREEN: Mono Test: NEGATIVE

## 2014-07-30 LAB — GC/CHLAMYDIA PROBE AMP

## 2014-11-16 ENCOUNTER — Emergency Department: Payer: Self-pay | Admitting: Emergency Medicine

## 2015-10-18 ENCOUNTER — Emergency Department (HOSPITAL_COMMUNITY)
Admission: EM | Admit: 2015-10-18 | Discharge: 2015-10-18 | Disposition: A | Discharge status: Home / Self Care | Payer: Self-pay | Attending: Emergency Medicine | Admitting: Emergency Medicine

## 2015-10-18 ENCOUNTER — Encounter (HOSPITAL_COMMUNITY): Payer: Self-pay | Admitting: *Deleted

## 2015-10-18 ENCOUNTER — Emergency Department (HOSPITAL_COMMUNITY): Payer: Medicaid Other

## 2015-10-18 DIAGNOSIS — N200 Calculus of kidney: Secondary | ICD-10-CM | POA: Insufficient documentation

## 2015-10-18 DIAGNOSIS — Z79899 Other long term (current) drug therapy: Secondary | ICD-10-CM | POA: Insufficient documentation

## 2015-10-18 DIAGNOSIS — Z91040 Latex allergy status: Secondary | ICD-10-CM | POA: Insufficient documentation

## 2015-10-18 DIAGNOSIS — Z3202 Encounter for pregnancy test, result negative: Secondary | ICD-10-CM | POA: Insufficient documentation

## 2015-10-18 DIAGNOSIS — R000 Tachycardia, unspecified: Secondary | ICD-10-CM | POA: Insufficient documentation

## 2015-10-18 DIAGNOSIS — F17210 Nicotine dependence, cigarettes, uncomplicated: Secondary | ICD-10-CM | POA: Insufficient documentation

## 2015-10-18 DIAGNOSIS — Z880 Allergy status to penicillin: Secondary | ICD-10-CM | POA: Insufficient documentation

## 2015-10-18 LAB — COMPREHENSIVE METABOLIC PANEL
ALBUMIN: 4.5 g/dL (ref 3.5–5.0)
ALT: 13 U/L — ABNORMAL LOW (ref 14–54)
AST: 17 U/L (ref 15–41)
Alkaline Phosphatase: 78 U/L (ref 38–126)
Anion gap: 13 (ref 5–15)
BUN: 13 mg/dL (ref 6–20)
CHLORIDE: 106 mmol/L (ref 101–111)
CO2: 20 mmol/L — ABNORMAL LOW (ref 22–32)
Calcium: 9.4 mg/dL (ref 8.9–10.3)
Creatinine, Ser: 0.43 mg/dL — ABNORMAL LOW (ref 0.44–1.00)
GFR calc Af Amer: >60 mL/min (ref >60)
GFR calc non Af Amer: >60 mL/min (ref >60)
GLUCOSE: 91 mg/dL (ref 65–99)
POTASSIUM: 3.5 mmol/L (ref 3.5–5.1)
Sodium: 139 mmol/L (ref 135–145)
Total Bilirubin: 0.1 mg/dL — ABNORMAL LOW (ref 0.3–1.2)
Total Protein: 7.8 g/dL (ref 6.5–8.1)

## 2015-10-18 LAB — CBC WITH DIFFERENTIAL/PLATELET
BASOS ABS: 0 10*3/uL (ref 0.0–0.1)
BASOS PCT: 0 %
EOS PCT: 1 %
Eosinophils Absolute: 0.1 10*3/uL (ref 0.0–0.7)
HCT: 40.4 % (ref 36.0–46.0)
Hemoglobin: 14.1 g/dL (ref 12.0–15.0)
Lymphocytes Relative: 33 %
Lymphs Abs: 4.2 10*3/uL — ABNORMAL HIGH (ref 0.7–4.0)
MCH: 32.9 pg (ref 26.0–34.0)
MCHC: 34.9 g/dL (ref 30.0–36.0)
MCV: 94.4 fL (ref 78.0–100.0)
MONO ABS: 1.3 10*3/uL — AB (ref 0.1–1.0)
Monocytes Relative: 10 %
Neutro Abs: 7 10*3/uL (ref 1.7–7.7)
Neutrophils Relative %: 56 %
PLATELETS: 420 10*3/uL — AB (ref 150–400)
RBC: 4.28 MIL/uL (ref 3.87–5.11)
RDW: 12.5 % (ref 11.5–15.5)
WBC: 12.7 10*3/uL — ABNORMAL HIGH (ref 4.0–10.5)

## 2015-10-18 LAB — URINALYSIS, ROUTINE W REFLEX MICROSCOPIC
Bilirubin Urine: NEGATIVE
GLUCOSE, UA: NEGATIVE mg/dL
Ketones, ur: NEGATIVE mg/dL
LEUKOCYTES UA: NEGATIVE
Nitrite: NEGATIVE
PH: 6 (ref 5.0–8.0)
PROTEIN: NEGATIVE mg/dL
Specific Gravity, Urine: <1.005 — ABNORMAL LOW (ref 1.005–1.030)
Urobilinogen, UA: 0.2 mg/dL (ref 0.0–1.0)

## 2015-10-18 LAB — URINE MICROSCOPIC-ADD ON

## 2015-10-18 LAB — PREGNANCY, URINE: Preg Test, Ur: NEGATIVE

## 2015-10-18 MED ORDER — OXYCODONE-ACETAMINOPHEN 5-325 MG PO TABS: 1.0000 | ORAL_TABLET | Freq: Four times a day (QID) | ORAL | Status: DC | PRN

## 2015-10-18 MED ORDER — KETOROLAC TROMETHAMINE 30 MG/ML IJ SOLN
INTRAMUSCULAR | Status: AC
  Filled 2015-10-18: qty 1

## 2015-10-18 MED ORDER — MORPHINE SULFATE (PF) 4 MG/ML IV SOLN
4.0000 mg | Freq: Once | INTRAVENOUS | Status: AC
  Administered 2015-10-18: 4 mg via INTRAVENOUS
  Filled 2015-10-18: qty 1

## 2015-10-18 MED ORDER — SODIUM CHLORIDE 0.9 % IV BOLUS (SEPSIS)
1000.0000 mL | Freq: Once | INTRAVENOUS | Status: AC
  Administered 2015-10-18: 1000 mL via INTRAVENOUS

## 2015-10-18 MED ORDER — ONDANSETRON HCL 4 MG/2ML IJ SOLN
4.0000 mg | Freq: Once | INTRAMUSCULAR | Status: AC
  Administered 2015-10-18: 4 mg via INTRAVENOUS

## 2015-10-18 MED ORDER — KETOROLAC TROMETHAMINE 30 MG/ML IJ SOLN
30.0000 mg | Freq: Once | INTRAMUSCULAR | Status: AC
  Administered 2015-10-18: 30 mg via INTRAVENOUS

## 2015-10-18 MED ORDER — ONDANSETRON HCL 4 MG/2ML IJ SOLN
4.0000 mg | Freq: Once | INTRAMUSCULAR | Status: DC
  Filled 2015-10-18: qty 2

## 2015-10-18 NOTE — ED Provider Notes (Signed)
CSN: [id number]     Arrival date & time 10/18/15  0018 History  By signing my name below, I, Emmanuella Mensah, attest that this documentation has been prepared under the direction and in the presence of Shon Baton, MD. Electronically Signed: Angelene Giovanni, ED Scribe. 10/18/2015. 12:41 AM.    Chief Complaint  Patient presents with  . Flank Pain   The history is provided by the patient. No language interpreter was used.   HPI Comments: Briana Davis is a 24 y.o. female with a hx of kidney stone who presents to the Emergency Department complaining of a gradually worsening constant 6/10 left flank pain onset 4 hours ago. She reports associated n/v, hematuria, urinary frequency and burning while urinating. She denies any fever or chills. She states that she took Ibuprofen PTA with mild relief. She explains that these symptoms are similar to her kidney stones in the past. She reports an allergy to amoxicillin and latex.   Past Medical History  Diagnosis Date  . Kidney stone    Past Surgical History  Procedure Laterality Date  . Tonsillectomy    . Wisdom tooth extraction     History reviewed. No pertinent family history. Social History  Substance Use Topics  . Smoking status: Current Every Day Smoker -- 1.00 packs/day    Types: Cigarettes  . Smokeless tobacco: None  . Alcohol Use: Yes     Comment: occasionally   OB History    No data available     Review of Systems  Constitutional: Negative for fever and chills.  Gastrointestinal: Positive for nausea and vomiting. Negative for diarrhea.  Genitourinary: Positive for frequency, hematuria and flank pain.  All other systems reviewed and are negative.     Allergies  Latex and Amoxicillin  Home Medications   Prior to Admission medications   Medication Sig Start Date End Date Taking? Authorizing Provider  ibuprofen (ADVIL,MOTRIN) 400 MG tablet Take 1 tablet (400 mg total) by mouth every 6 (six) hours as needed for  pain. 07/31/13  Yes Dione Booze, MD  sertraline (ZOLOFT) 25 MG tablet Take 25 mg by mouth daily.   Yes Historical Provider, MD  oxyCODONE-acetaminophen (PERCOCET/ROXICET) 5-325 MG tablet Take 1 tablet by mouth every 6 (six) hours as needed for severe pain. 10/18/15   Shon Baton, MD   BP 143/95 mmHg  Pulse 130  Temp(Src) 98.2 F (36.8 C) (Oral)  Resp 18  Ht  (1.651 m)  Wt 135 lb (61.236 kg)  BMI 22.47 kg/m2  SpO2 99%  LMP 10/14/2015 Physical Exam  Constitutional: She is oriented to person, place, and time. She appears well-developed and well-nourished.  Uncomfortable appearing  HENT:  Head: Normocephalic and atraumatic.  Cardiovascular: Regular rhythm and normal heart sounds.   No murmur heard. Tachycardia  Pulmonary/Chest: Effort normal and breath sounds normal. No respiratory distress. She has no wheezes.  Abdominal: Soft. There is no tenderness.  Genitourinary:  No CVA tenderness  Neurological: She is alert and oriented to person, place, and time.  Skin: Skin is warm and dry.  Psychiatric: She has a normal mood and affect.  Nursing note and vitals reviewed.   ED Course  Procedures (including critical care time) DIAGNOSTIC STUDIES: Oxygen Saturation is 99% on RA, normal by my interpretation.    COORDINATION OF CARE: 12:37 AM- Pt advised of plan for treatment and pt agrees. Pt will be treated for a kidney stone. Explained lack of access to Ultrasound at this time. Also advised  against another CT due to radiation and pt's past hx of kidney stones.    Labs Review Labs Reviewed  CBC WITH DIFFERENTIAL/PLATELET - Abnormal; Notable for the following:    WBC 12.7 (*)    Platelets 420 (*)    Lymphs Abs 4.2 (*)    Monocytes Absolute 1.3 (*)    All other components within normal limits  COMPREHENSIVE METABOLIC PANEL - Abnormal; Notable for the following:    CO2 20 (*)    Creatinine, Ser 0.43 (*)    ALT 13 (*)    Total Bilirubin 0.1 (*)    All other components  within normal limits  URINALYSIS, ROUTINE W REFLEX MICROSCOPIC (NOT AT [facility]) - Abnormal; Notable for the following:    Color, Urine RED (*)    Specific Gravity, Urine <1.005 (*)    Hgb urine dipstick LARGE (*)    All other components within normal limits  URINE MICROSCOPIC-ADD ON - Abnormal; Notable for the following:    Squamous Epithelial / LPF 0-5 (*)    Bacteria, UA MANY (*)    All other components within normal limits  PREGNANCY, URINE    Imaging Review Dg Abd 1 View  10/18/2015  CLINICAL DATA:  Left flank pain for 4 hours radiating to the back. History of kidney stones. EXAM: ABDOMEN - 1 VIEW COMPARISON:  CT 09/26/2012 FINDINGS: Punctate stone projects over the interpolar left kidney. No stones over the course of the ureter or in the region of the bladder. A left pelvic phlebolith is unchanged from prior CT. No definite right nephrolithiasis. Normal bowel gas pattern. No osseous abnormality. IMPRESSION: Punctate left nephrolithiasis. No stones over the course of the ureter or in the bladder. Electronically Signed   By: Rubye OaksMelanie  Ehinger M.D.   On: 10/18/2015 02:17     Shon Batonourtney F Katrell Milhorn, MD has personally reviewed and evaluated these lab results as part of my medical decision-making.   EKG Interpretation None      MDM   Final diagnoses:  Kidney stone   Patient presents with pain consistent with prior kidney stones. Also reports hematuria. Is uncomfortable appearing but nontoxic. His tachycardic. This may be related to pain.  Urine has too numerous to count red cells.  Other lab work is largely reassuring. KUB of the abdomen shows no obstructing stone but multiple stones the left kidney. Will treat patient as a presumed kidney stone.   2:43 AM On recheck, patient reports improvement of pain. Discussed with patient supportive management and expectant management at home. She was given urology follow-up.  After history, exam, and medical workup I feel the patient has been  appropriately medically screened and is safe for discharge home. Pertinent diagnoses were discussed with the patient. Patient was given return precautions.  I personally performed the services described in this documentation, which was scribed in my presence. The recorded information has been reviewed and is accurate.   Shon Batonourtney F Zailyn Rowser, MD 10/18/15 [phone]

## 2015-10-18 NOTE — ED Notes (Signed)
Pt c/o left flank pain that started x 4 hours ago; pt states she has some urinary urgency

## 2015-10-18 NOTE — Discharge Instructions (Signed)
Kidney Stones °Kidney stones (urolithiasis) are deposits that form inside your kidneys. The intense pain is caused by the stone moving through the urinary tract. When the stone moves, the ureter goes into spasm around the stone. The stone is usually passed in the urine.  °CAUSES  °· A disorder that makes certain neck glands produce too much parathyroid hormone (primary hyperparathyroidism). °· A buildup of uric acid crystals, similar to gout in your joints. °· Narrowing (stricture) of the ureter. °· A kidney obstruction present at birth (congenital obstruction). °· Previous surgery on the kidney or ureters. °· Numerous kidney infections. °SYMPTOMS  °· Feeling sick to your stomach (nauseous). °· Throwing up (vomiting). °· Blood in the urine (hematuria). °· Pain that usually spreads (radiates) to the groin. °· Frequency or urgency of urination. °DIAGNOSIS  °· Taking a history and physical exam. °· Blood or urine tests. °· CT scan. °· Occasionally, an examination of the inside of the urinary bladder (cystoscopy) is performed. °TREATMENT  °· Observation. °· Increasing your fluid intake. °· Extracorporeal shock wave lithotripsy--This is a noninvasive procedure that uses shock waves to break up kidney stones. °· Surgery may be needed if you have severe pain or persistent obstruction. There are various surgical procedures. Most of the procedures are performed with the use of small instruments. Only small incisions are needed to accommodate these instruments, so recovery time is minimized. °The size, location, and chemical composition are all important variables that will determine the proper choice of action for you. Talk to your health care provider to better understand your situation so that you will minimize the risk of injury to yourself and your kidney.  °HOME CARE INSTRUCTIONS  °· Drink enough water and fluids to keep your urine clear or pale yellow. This will help you to pass the stone or stone fragments. °· Strain  all urine through the provided strainer. Keep all particulate matter and stones for your health care provider to see. The stone causing the pain may be as small as a grain of salt. It is very important to use the strainer each and every time you pass your urine. The collection of your stone will allow your health care provider to analyze it and verify that a stone has actually passed. The stone analysis will often identify what you can do to reduce the incidence of recurrences. °· Only take over-the-counter or prescription medicines for pain, discomfort, or fever as directed by your health care provider. °· Keep all follow-up visits as told by your health care provider. This is important. °· Get follow-up X-rays if required. The absence of pain does not always mean that the stone has passed. It may have only stopped moving. If the urine remains completely obstructed, it can cause loss of kidney function or even complete destruction of the kidney. It is your responsibility to make sure X-rays and follow-ups are completed. Ultrasounds of the kidney can show blockages and the status of the kidney. Ultrasounds are not associated with any radiation and can be performed easily in a matter of minutes. °· Make changes to your daily diet as told by your health care provider. You may be told to: °¨ Limit the amount of salt that you eat. °¨ Eat 5 or more servings of fruits and vegetables each day. °¨ Limit the amount of meat, poultry, fish, and eggs that you eat. °· Collect a 24-hour urine sample as told by your health care provider. You may need to collect another urine sample every 6-12   months. °SEEK MEDICAL CARE IF: °· You experience pain that is progressive and unresponsive to any pain medicine you have been prescribed. °SEEK IMMEDIATE MEDICAL CARE IF:  °· Pain cannot be controlled with the prescribed medicine. °· You have a fever or shaking chills. °· The severity or intensity of pain increases over 18 hours and is not  relieved by pain medicine. °· You develop a new onset of abdominal pain. °· You feel faint or pass out. °· You are unable to urinate. °  °This information is not intended to replace advice given to you by your health care provider. Make sure you discuss any questions you have with your health care provider. °  °Document Released: 11/19/2005 Document Revised: 08/10/2015 Document Reviewed: 04/22/2013 °Elsevier Interactive Patient Education ©2016 Elsevier Inc. ° °

## 2015-10-18 NOTE — ED Notes (Signed)
Pt alert & oriented x4, stable gait. Patient given discharge instructions, paperwork & prescription(s). Patient informed not to drive, operate any equipment & handel any important documents 4 hours after taking pain medication. Patient  instructed to stop at the registration desk to finish any additional paperwork. Patient  verbalized understanding. Pt left department w/ no further questions. 

## 2015-10-18 NOTE — ED Notes (Signed)
Pt states left flank pain for the past 4 hours w/ nausea.

## 2015-10-23 ENCOUNTER — Emergency Department
Admission: EM | Admit: 2015-10-23 | Discharge: 2015-10-24 | Disposition: A | Discharge status: Home / Self Care | Payer: Medicaid Other | Attending: Emergency Medicine | Admitting: Emergency Medicine

## 2015-10-23 ENCOUNTER — Encounter: Payer: Self-pay | Admitting: Emergency Medicine

## 2015-10-23 DIAGNOSIS — Z880 Allergy status to penicillin: Secondary | ICD-10-CM | POA: Insufficient documentation

## 2015-10-23 DIAGNOSIS — F1310 Sedative, hypnotic or anxiolytic abuse, uncomplicated: Secondary | ICD-10-CM | POA: Insufficient documentation

## 2015-10-23 DIAGNOSIS — F1010 Alcohol abuse, uncomplicated: Secondary | ICD-10-CM

## 2015-10-23 DIAGNOSIS — Z79899 Other long term (current) drug therapy: Secondary | ICD-10-CM | POA: Insufficient documentation

## 2015-10-23 DIAGNOSIS — F17210 Nicotine dependence, cigarettes, uncomplicated: Secondary | ICD-10-CM | POA: Insufficient documentation

## 2015-10-23 DIAGNOSIS — Z91040 Latex allergy status: Secondary | ICD-10-CM | POA: Insufficient documentation

## 2015-10-23 DIAGNOSIS — Z3202 Encounter for pregnancy test, result negative: Secondary | ICD-10-CM | POA: Insufficient documentation

## 2015-10-23 HISTORY — DX: Depression, unspecified: F32.A

## 2015-10-23 HISTORY — DX: Major depressive disorder, single episode, unspecified: F32.9

## 2015-10-23 LAB — CBC
HEMATOCRIT: 43.7 % (ref 35.0–47.0)
HEMOGLOBIN: 14.5 g/dL (ref 12.0–16.0)
MCH: 31.6 pg (ref 26.0–34.0)
MCHC: 33.1 g/dL (ref 32.0–36.0)
MCV: 95.5 fL (ref 80.0–100.0)
Platelets: 457 10*3/uL — ABNORMAL HIGH (ref 150–440)
RBC: 4.57 MIL/uL (ref 3.80–5.20)
RDW: 12.9 % (ref 11.5–14.5)
WBC: 14.4 10*3/uL — AB (ref 3.6–11.0)

## 2015-10-23 LAB — URINE DRUG SCREEN, QUALITATIVE (ARMC ONLY)
AMPHETAMINES, UR SCREEN: NOT DETECTED
BENZODIAZEPINE, UR SCRN: POSITIVE — AB
Barbiturates, Ur Screen: NOT DETECTED
COCAINE METABOLITE, UR ~~LOC~~: NOT DETECTED
Cannabinoid 50 Ng, Ur ~~LOC~~: NOT DETECTED
MDMA (ECSTASY) UR SCREEN: NOT DETECTED
METHADONE SCREEN, URINE: NOT DETECTED
OPIATE, UR SCREEN: NOT DETECTED
PHENCYCLIDINE (PCP) UR S: NOT DETECTED
Tricyclic, Ur Screen: NOT DETECTED

## 2015-10-23 LAB — COMPREHENSIVE METABOLIC PANEL
ALT: 20 U/L (ref 14–54)
AST: 25 U/L (ref 15–41)
Albumin: 4.4 g/dL (ref 3.5–5.0)
Alkaline Phosphatase: 80 U/L (ref 38–126)
Anion gap: 7 (ref 5–15)
BUN: 16 mg/dL (ref 6–20)
CHLORIDE: 105 mmol/L (ref 101–111)
CO2: 25 mmol/L (ref 22–32)
Calcium: 9.2 mg/dL (ref 8.9–10.3)
Creatinine, Ser: 0.66 mg/dL (ref 0.44–1.00)
Glucose, Bld: 113 mg/dL — ABNORMAL HIGH (ref 65–99)
POTASSIUM: 3.8 mmol/L (ref 3.5–5.1)
SODIUM: 137 mmol/L (ref 135–145)
Total Bilirubin: 0.3 mg/dL (ref 0.3–1.2)
Total Protein: 7.7 g/dL (ref 6.5–8.1)

## 2015-10-23 LAB — T4, FREE: FREE T4: 0.74 ng/dL (ref 0.61–1.12)

## 2015-10-23 LAB — TSH: TSH: 1.407 u[IU]/mL (ref 0.350–4.500)

## 2015-10-23 LAB — ETHANOL

## 2015-10-23 LAB — POCT PREGNANCY, URINE: Preg Test, Ur: NEGATIVE

## 2015-10-23 MED ORDER — SERTRALINE HCL 50 MG PO TABS: 50.0000 mg | ORAL_TABLET | Freq: Every day | ORAL | Status: DC
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
## 2015-10-23 NOTE — ED Notes (Signed)
Pt dressed out by Vernona RiegerLaura, CNA. Sweatshirt, sweatpants, camisole, bra, socks and shoes placed in belongings bag. Pt's belly button ring and hair tie placed in cup and placed in bag with other belongings. Bag taken by CNA to the back.

## 2015-10-23 NOTE — ED Notes (Signed)
BEHAVIORAL HEALTH ROUNDING  Patient sleeping: No.  Patient alert and oriented: yes  Behavior appropriate: Yes. ; If no, describe:  Nutrition and fluids offered: Yes  Toileting and hygiene offered: Yes  Sitter present: not applicable  Law enforcement present: Yes ODS  
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
## 2015-10-23 NOTE — ED Notes (Signed)
IVC/Consult ordered ?

## 2015-10-23 NOTE — Discharge Instructions (Signed)
You have been seen in the emergency department for a  psychiatric/substance concern. You have been evaluated both medically as well as psychiatrically. Please follow-up with your outpatient resources provided. Return to the emergency department for any worsening symptoms, or any thoughts of hurting yourself or anyone else so that we may attempt to help you.   Alcohol Use Disorder Alcohol use disorder is a mental disorder. It is not a one-time incident of heavy drinking. Alcohol use disorder is the excessive and uncontrollable use of alcohol over time that leads to problems with functioning in one or more areas of daily living. People with this disorder risk harming themselves and others when they drink to excess. Alcohol use disorder also can cause other mental disorders, such as mood and anxiety disorders, and serious physical problems. People with alcohol use disorder often misuse other drugs.  Alcohol use disorder is common and widespread. Some people with this disorder drink alcohol to cope with or escape from negative life events. Others drink to relieve chronic pain or symptoms of mental illness. People with a family history of alcohol use disorder are at higher risk of losing control and using alcohol to excess.  Drinking too much alcohol can cause injury, accidents, and health problems. One drink can be too much when you are:  Working.  Pregnant or breastfeeding.  Taking medicines. Ask your doctor.  Driving or planning to drive. SYMPTOMS  Signs and symptoms of alcohol use disorder may include the following:   Consumption ofalcohol inlarger amounts or over a longer period of time than intended.  Multiple unsuccessful attempts to cutdown or control alcohol use.   A great deal of time spent obtaining alcohol, using alcohol, or recovering from the effects of alcohol (hangover).  A strong desire or urge to use alcohol (cravings).   Continued use of alcohol despite problems at work,  school, or home because of alcohol use.   Continued use of alcohol despite problems in relationships because of alcohol use.  Continued use of alcohol in situations when it is physically hazardous, such as driving a car.  Continued use of alcohol despite awareness of a physical or psychological problem that is likely related to alcohol use. Physical problems related to alcohol use can involve the brain, heart, liver, stomach, and intestines. Psychological problems related to alcohol use include intoxication, depression, anxiety, psychosis, delirium, and dementia.   The need for increased amounts of alcohol to achieve the same desired effect, or a decreased effect from the consumption of the same amount of alcohol (tolerance).  Withdrawal symptoms upon reducing or stopping alcohol use, or alcohol use to reduce or avoid withdrawal symptoms. Withdrawal symptoms include:  Racing heart.  Hand tremor.  Difficulty sleeping.  Nausea.  Vomiting.  Hallucinations.  Restlessness.  Seizures. DIAGNOSIS Alcohol use disorder is diagnosed through an assessment by your health care provider. Your health care provider may start by asking three or four questions to screen for excessive or problematic alcohol use. To confirm a diagnosis of alcohol use disorder, at least two symptoms must be present within a 3-month period. The severity of alcohol use disorder depends on the number of symptoms:  Mild--two or three.  Moderate--four or five.  Severe--six or more. Your health care provider may perform a physical exam or use results from lab tests to see if you have physical problems resulting from alcohol use. Your health care provider may refer you to a mental health professional for evaluation. TREATMENT  Some people with alcohol use  disorder are able to reduce their alcohol use to low-risk levels. Some people with alcohol use disorder need to quit drinking alcohol. When necessary, mental health  professionals with specialized training in substance use treatment can help. Your health care provider can help you decide how severe your alcohol use disorder is and what type of treatment you need. The following forms of treatment are available:   Detoxification. Detoxification involves the use of prescription medicines to prevent alcohol withdrawal symptoms in the first week after quitting. This is important for people with a history of symptoms of withdrawal and for heavy drinkers who are likely to have withdrawal symptoms. Alcohol withdrawal can be dangerous and, in severe cases, cause death. Detoxification is usually provided in a hospital or in-patient substance use treatment facility.  Counseling or talk therapy. Talk therapy is provided by substance use treatment counselors. It addresses the reasons people use alcohol and ways to keep them from drinking again. The goals of talk therapy are to help people with alcohol use disorder find healthy activities and ways to cope with life stress, to identify and avoid triggers for alcohol use, and to handle cravings, which can cause relapse.  Medicines.Different medicines can help treat alcohol use disorder through the following actions:  Decrease alcohol cravings.  Decrease the positive reward response felt from alcohol use.  Produce an uncomfortable physical reaction when alcohol is used (aversion therapy).  Support groups. Support groups are run by people who have quit drinking. They provide emotional support, advice, and guidance. These forms of treatment are often combined. Some people with alcohol use disorder benefit from intensive combination treatment provided by specialized substance use treatment centers. Both inpatient and outpatient treatment programs are available.   This information is not intended to replace advice given to you by your health care provider. Make sure you discuss any questions you have with your health care  provider.   Document Released: 12/27/2004 Document Revised: 12/10/2014 Document Reviewed: 02/26/2013 Elsevier Interactive Patient Education [employer]2016 Elsevier Inc.

## 2015-10-23 NOTE — ED Notes (Signed)
SOC MD called with recommendations on pt. Call given to Dr Lenard LancePaduchowski.

## 2015-10-23 NOTE — ED Notes (Signed)

## 2015-12-15 ENCOUNTER — Emergency Department
Admission: EM | Admit: 2015-12-15 | Discharge: 2015-12-15 | Disposition: A | Discharge status: Other Acute Inpatient Hospital | Payer: Self-pay | Attending: Emergency Medicine | Admitting: Emergency Medicine

## 2015-12-15 ENCOUNTER — Encounter: Payer: Self-pay | Admitting: *Deleted

## 2015-12-15 DIAGNOSIS — R45851 Suicidal ideations: Secondary | ICD-10-CM

## 2015-12-15 DIAGNOSIS — Z3202 Encounter for pregnancy test, result negative: Secondary | ICD-10-CM | POA: Insufficient documentation

## 2015-12-15 DIAGNOSIS — F1011 Alcohol abuse, in remission: Secondary | ICD-10-CM

## 2015-12-15 DIAGNOSIS — F32A Depression, unspecified: Secondary | ICD-10-CM

## 2015-12-15 DIAGNOSIS — Z880 Allergy status to penicillin: Secondary | ICD-10-CM | POA: Insufficient documentation

## 2015-12-15 DIAGNOSIS — Z91040 Latex allergy status: Secondary | ICD-10-CM | POA: Insufficient documentation

## 2015-12-15 DIAGNOSIS — F331 Major depressive disorder, recurrent, moderate: Secondary | ICD-10-CM

## 2015-12-15 DIAGNOSIS — F1310 Sedative, hypnotic or anxiolytic abuse, uncomplicated: Secondary | ICD-10-CM | POA: Insufficient documentation

## 2015-12-15 DIAGNOSIS — F4010 Social phobia, unspecified: Secondary | ICD-10-CM

## 2015-12-15 DIAGNOSIS — Z79899 Other long term (current) drug therapy: Secondary | ICD-10-CM | POA: Insufficient documentation

## 2015-12-15 DIAGNOSIS — F329 Major depressive disorder, single episode, unspecified: Secondary | ICD-10-CM | POA: Insufficient documentation

## 2015-12-15 LAB — ETHANOL

## 2015-12-15 LAB — CBC
HCT: 39.2 % (ref 35.0–47.0)
HEMOGLOBIN: 13 g/dL (ref 12.0–16.0)
MCH: 31 pg (ref 26.0–34.0)
MCHC: 33.1 g/dL (ref 32.0–36.0)
MCV: 93.7 fL (ref 80.0–100.0)
Platelets: 480 10*3/uL — ABNORMAL HIGH (ref 150–440)
RBC: 4.18 MIL/uL (ref 3.80–5.20)
RDW: 12.8 % (ref 11.5–14.5)
WBC: 11.1 10*3/uL — ABNORMAL HIGH (ref 3.6–11.0)

## 2015-12-15 LAB — URINE DRUG SCREEN, QUALITATIVE (ARMC ONLY)
Amphetamines, Ur Screen: NOT DETECTED
BARBITURATES, UR SCREEN: NOT DETECTED
Benzodiazepine, Ur Scrn: NOT DETECTED
CANNABINOID 50 NG, UR ~~LOC~~: NOT DETECTED
COCAINE METABOLITE, UR ~~LOC~~: NOT DETECTED
MDMA (ECSTASY) UR SCREEN: NOT DETECTED
Methadone Scn, Ur: NOT DETECTED
Opiate, Ur Screen: NOT DETECTED
PHENCYCLIDINE (PCP) UR S: NOT DETECTED
Tricyclic, Ur Screen: POSITIVE — AB

## 2015-12-15 LAB — COMPREHENSIVE METABOLIC PANEL
ALK PHOS: 64 U/L (ref 38–126)
ALT: 12 U/L — AB (ref 14–54)
AST: 15 U/L (ref 15–41)
Albumin: 4.5 g/dL (ref 3.5–5.0)
Anion gap: 8 (ref 5–15)
BUN: 11 mg/dL (ref 6–20)
CALCIUM: 9.1 mg/dL (ref 8.9–10.3)
CHLORIDE: 104 mmol/L (ref 101–111)
CO2: 26 mmol/L (ref 22–32)
CREATININE: 0.61 mg/dL (ref 0.44–1.00)
Glucose, Bld: 89 mg/dL (ref 65–99)
Potassium: 3.6 mmol/L (ref 3.5–5.1)
Sodium: 138 mmol/L (ref 135–145)
Total Bilirubin: 0.9 mg/dL (ref 0.3–1.2)
Total Protein: 7.5 g/dL (ref 6.5–8.1)

## 2015-12-15 LAB — SALICYLATE LEVEL

## 2015-12-15 LAB — POCT PREGNANCY, URINE: Preg Test, Ur: NEGATIVE

## 2015-12-15 LAB — ACETAMINOPHEN LEVEL: Acetaminophen (Tylenol), Serum: <10 ug/mL — ABNORMAL LOW (ref 10–30)

## 2015-12-15 MED ORDER — FLUOXETINE HCL 10 MG PO CAPS: 10.0000 mg | ORAL_CAPSULE | Freq: Every day | ORAL | Status: DC

## 2015-12-15 NOTE — ED Notes (Signed)
BEHAVIORAL HEALTH ROUNDING Patient sleeping: No. Patient alert and oriented: yes Behavior appropriate: Yes.  ; If no, describe:  Nutrition and fluids offered: yes Toileting and hygiene offered: Yes  Sitter present: q15 minute observations and security  monitoring Law enforcement present: Yes  ODS  

## 2015-12-15 NOTE — ED Notes (Signed)
BEHAVIORAL HEALTH ROUNDING Patient sleeping: No. Patient alert and oriented: yes Behavior appropriate: Yes.  ; If no, describe:  Nutrition and fluids offered: yes Toileting and hygiene offered: Yes  Sitter present: q15 minute observations and security  ENVIRONMENTAL ASSESSMENT Potentially harmful objects out of patient reach: Yes.   Personal belongings secured: Yes.   Patient dressed in hospital provided attire only: Yes.   Plastic bags out of patient reach: Yes.   Patient care equipment (cords, cables, call bells, lines, and drains) shortened, removed, or accounted for: Yes.   Equipment and supplies removed from bottom of stretcher: Yes.   Potentially toxic materials out of patient reach: Yes.   Sharps container removed or out of patient reach: Yes.   monitoring Law enforcement present: Yes  ODS  

## 2015-12-15 NOTE — ED Provider Notes (Signed)
[facility] Emergency Department Provider Note  ____________________________________________  Time seen: 12:20 PM  I have reviewed the triage vital signs and the nursing notes.   HISTORY  Chief Complaint Suicidal    HPI Briana Davis is a 25 y.o. female who complains of depressed mood and having some passive suicidal ideation. She has no history of prior suicide attempts contrary to the triage note. No plan to harm herself now the whole reason why she is seeking help is that she is concerned that she has inadequate motivation and she really wants to go to school and better her life. She is future oriented. Denies any alterations in her appetite or sleep patterns. Never been treated for depression in the past. Has no medical problems.     Past Medical History  Diagnosis Date  . Kidney stone   . Depression      There are no active problems to display for this patient.    Past Surgical History  Procedure Laterality Date  . Tonsillectomy    . Wisdom tooth extraction       Current Outpatient Rx  Name  Route  Sig  Dispense  Refill  . ibuprofen (ADVIL,MOTRIN) 400 MG tablet   Oral   Take 1 tablet (400 mg total) by mouth every 6 (six) hours as needed for pain.   30 tablet   0   . oxyCODONE-acetaminophen (PERCOCET/ROXICET) 5-325 MG tablet   Oral   Take 1 tablet by mouth every 6 (six) hours as needed for severe pain.   15 tablet   0   . sertraline (ZOLOFT) 50 MG tablet   Oral   Take 1 tablet (50 mg total) by mouth daily.   30 tablet   2      Allergies Latex and Amoxicillin   History reviewed. No pertinent family history.  Social History Social History  Substance Use Topics  . Smoking status: Current Every Day Smoker -- 1.00 packs/day    Types: Cigarettes  . Smokeless tobacco: None  . Alcohol Use: 3.0 oz/week    2 Cans of beer, 3 Shots of liquor per week     Comment: occasionally    Review of Systems  Constitutional:   No  fever or chills. No weight changes Eyes:   No blurry vision or double vision.  ENT:   No sore throat. Cardiovascular:   No chest pain. Respiratory:   No dyspnea or cough. Gastrointestinal:   Negative for abdominal pain, vomiting and diarrhea.  No BRBPR or melena. Genitourinary:   Negative for dysuria, urinary retention, bloody urine, or difficulty urinating. Musculoskeletal:   Negative for back pain. No joint swelling or pain. Skin:   Negative for rash. Neurological:   Negative for headaches, focal weakness or numbness. Psychiatric:  Depressed mood with suicidal ideation, no plan, no homicidal ideation or hallucinations.   Endocrine:  No hot/cold intolerance, changes in energy, or sleep difficulty.  10-point ROS otherwise negative.  ____________________________________________   PHYSICAL EXAM:  VITAL SIGNS: ED Triage Vitals  Enc Vitals Group     BP 12/15/15 1150 144/83 mmHg     Pulse Rate 12/15/15 1150 120     Resp 12/15/15 1150 18     Temp 12/15/15 1150 98.9 F (37.2 C)     Temp Source 12/15/15 1150 Oral     SpO2 12/15/15 1150 100 %     Weight 12/15/15 1150 140 lb (63.504 kg)     Height 12/15/15 1150 5\' 5"  (1.651  m)     Head Cir --      Peak Flow --      Pain Score 12/15/15 1150 0     Pain Loc --      Pain Edu? --      Excl. in GC? --     Vital signs reviewed, nursing assessments reviewed.   Constitutional:   Alert and oriented. Well appearing and in no distress. Eyes:   No scleral icterus. No conjunctival pallor. PERRL. EOMI ENT   Head:   Normocephalic and atraumatic.   Nose:   No congestion/rhinnorhea. No septal hematoma   Mouth/Throat:   MMM, no pharyngeal erythema. No peritonsillar mass. No uvula shift.   Neck:   No stridor. No SubQ emphysema. No meningismus. Hematological/Lymphatic/Immunilogical:   No cervical lymphadenopathy. Cardiovascular:   RRR. Normal and symmetric distal pulses are present in all extremities. No murmurs, rubs, or  gallops. Respiratory:   Normal respiratory effort without tachypnea nor retractions. Breath sounds are clear and equal bilaterally. No wheezes/rales/rhonchi. Gastrointestinal:   Soft and nontender. No distention. There is no CVA tenderness.  No rebound, rigidity, or guarding. Genitourinary:   deferred Musculoskeletal:   Nontender with normal range of motion in all extremities. No joint effusions.  No lower extremity tenderness.  No edema. Neurologic:   Normal speech and language.  CN 2-10 normal. Motor grossly intact. No pronator drift.  Normal gait. No gross focal neurologic deficits are appreciated.  Skin:    Skin is warm, dry and intact. No rash noted.  No petechiae, purpura, or bullae. Psychiatric:   Depressed mood, tearful affect. Speech and behavior are normal. Patient exhibits appropriate insight and judgment.  ____________________________________________    LABS (pertinent positives/negatives) (all labs ordered are listed, but only abnormal results are displayed) Labs Reviewed  COMPREHENSIVE METABOLIC PANEL - Abnormal; Notable for the following:    ALT 12 (*)    All other components within normal limits  ACETAMINOPHEN LEVEL - Abnormal; Notable for the following:    Acetaminophen (Tylenol), Serum <10 (*)    All other components within normal limits  CBC - Abnormal; Notable for the following:    WBC 11.1 (*)    Platelets 480 (*)    All other components within normal limits  URINE DRUG SCREEN, QUALITATIVE ([facility] ONLY) - Abnormal; Notable for the following:    Tricyclic, Ur Screen POSITIVE (*)    All other components within normal limits  ETHANOL  SALICYLATE LEVEL  POCT PREGNANCY, URINE   ____________________________________________   EKG    ____________________________________________    RADIOLOGY    ____________________________________________   PROCEDURES   ____________________________________________   INITIAL IMPRESSION / ASSESSMENT AND PLAN / ED  COURSE  Pertinent labs & imaging results that were available during my care of the patient were reviewed by me and considered in my medical decision making (see chart for details).  Patient well appearing no acute distress. Medically stable. Has some symptoms of depression. Wants to see a psychiatrist. No active plan, her symptoms do not meet commitment criteria she is overall low risk for harm to herself or others. We'll obtain a psychiatric consult if able but if not a think the patient is suitable for outpatient follow-up with [facility].     ____________________________________________   FINAL CLINICAL IMPRESSION(S) / ED DIAGNOSES  Final diagnoses:  Suicidal ideation      Sharman Cheek, MD 12/15/15 1505

## 2015-12-15 NOTE — ED Notes (Signed)
Received a call from her mother Ines BloomerRuby Mitchener [phone]  Mother would like for the psychiatrist to call her either before or after his consult with her
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
## 2015-12-15 NOTE — ED Provider Notes (Signed)
-----------------------------------------   5:50 PM on 12/15/2015 -----------------------------------------  The patient has been seen and evaluated by psychiatry. They believe the patient is safe for discharge, no active suicidal ideation. Psychiatry is restarted the patient's Prozac. The patient has out patient resources in place with whom she will follow up with. I discussed this plan of care with the patient who is agreeable to the plan, and believes she is safe to go home.  Minna AntisKevin Adisa Vigeant, MD 12/15/15 1750

## 2015-12-15 NOTE — Discharge Instructions (Signed)
You have been seen in the emergency department for a  psychiatric concern. You have been evaluated both medically as well as psychiatrically. Please follow-up with your outpatient resources provided. Return to the emergency department for any worsening symptoms, or any thoughts of hurting yourself or anyone else so that we may attempt to help you.   Major Depressive Disorder Major depressive disorder is a mental illness. It also may be called clinical depression or unipolar depression. Major depressive disorder usually causes feelings of sadness, hopelessness, or helplessness. Some people with this disorder do not feel particularly sad but lose interest in doing things they used to enjoy (anhedonia). Major depressive disorder also can cause physical symptoms. It can interfere with work, school, relationships, and other normal everyday activities. The disorder varies in severity but is longer lasting and more serious than the sadness we all feel from time to time in our lives. Major depressive disorder often is triggered by stressful life events or major life changes. Examples of these triggers include divorce, loss of your job or home, a move, and the death of a family member or close friend. Sometimes this disorder occurs for no obvious reason at all. People who have family members with major depressive disorder or bipolar disorder are at higher risk for developing this disorder, with or without life stressors. Major depressive disorder can occur at any age. It may occur just once in your life (single episode major depressive disorder). It may occur multiple times (recurrent major depressive disorder). SYMPTOMS People with major depressive disorder have either anhedonia or depressed mood on nearly a daily basis for at least 2 weeks or longer. Symptoms of depressed mood include:  Feelings of sadness (blue or down in the dumps) or emptiness.  Feelings of hopelessness or helplessness.  Tearfulness or  episodes of crying (may be observed by others).  Irritability (children and adolescents). In addition to depressed mood or anhedonia or both, people with this disorder have at least four of the following symptoms:  Difficulty sleeping or sleeping too much.   Significant change (increase or decrease) in appetite or weight.   Lack of energy or motivation.  Feelings of guilt and worthlessness.   Difficulty concentrating, remembering, or making decisions.  Unusually slow movement (psychomotor retardation) or restlessness (as observed by others).   Recurrent wishes for death, recurrent thoughts of self-harm (suicide), or a suicide attempt. People with major depressive disorder commonly have persistent negative thoughts about themselves, other people, and the world. People with severe major depressive disorder may experiencedistorted beliefs or perceptions about the world (psychotic delusions). They also may see or hear things that are not real (psychotic hallucinations). DIAGNOSIS Major depressive disorder is diagnosed through an assessment by your health care provider. Your health care provider will ask aboutaspects of your daily life, such as mood,sleep, and appetite, to see if you have the diagnostic symptoms of major depressive disorder. Your health care provider may ask about your medical history and use of alcohol or drugs, including prescription medicines. Your health care provider also may do a physical exam and blood work. This is because certain medical conditions and the use of certain substances can cause major depressive disorder-like symptoms (secondary depression). Your health care provider also may refer you to a mental health specialist for further evaluation and treatment. TREATMENT It is important to recognize the symptoms of major depressive disorder and seek treatment. The following treatments can be prescribed for this disorder:   Medicine. Antidepressant medicines  usually are prescribed.  Antidepressant medicines are thought to correct chemical imbalances in the brain that are commonly associated with major depressive disorder. Other types of medicine may be added if the symptoms do not respond to antidepressant medicines alone or if psychotic delusions or hallucinations occur.  Talk therapy. Talk therapy can be helpful in treating major depressive disorder by providing support, education, and guidance. Certain types of talk therapy also can help with negative thinking (cognitive behavioral therapy) and with relationship issues that trigger this disorder (interpersonal therapy). A mental health specialist can help determine which treatment is best for you. Most people with major depressive disorder do well with a combination of medicine and talk therapy. Treatments involving electrical stimulation of the brain can be used in situations with extremely severe symptoms or when medicine and talk therapy do not work over time. These treatments include electroconvulsive therapy, transcranial magnetic stimulation, and vagal nerve stimulation.   This information is not intended to replace advice given to you by your health care provider. Make sure you discuss any questions you have with your health care provider.   Document Released: 03/16/2013 Document Revised: 12/10/2014 Document Reviewed: 03/16/2013 Elsevier Interactive Patient Education 2016 [employer]   Suicidal Feelings: How to Help Yourself Suicide is the taking of one's own life. If you feel as though life is getting too tough to handle and are thinking about suicide, get help right away. To get help:  Call your local emergency services (911 in the [place]  Call a suicide hotline to speak with a trained counselor who understands how you are feeling. The following is a list of suicide hotlines in the [place]. For a list of hotlines in [place], visit  [employer].itwww.suicide.org/hotlines/international/[place]-suicide-hotlines.html.  1-800-273-TALK 912-875-0137(1-567-249-0568).  1-800-SUICIDE 636-298-3516(1-6844912205).  310-453-32711-930-381-0863. This is a hotline for Spanish speakers.  8-413-244-0NUU1-800-799-4TTY (435) 706-0257(1-249-617-3460). This is a hotline for TTY users.  1-866-4-U-TREVOR 956-504-5325(1-747 551 6783). This is a hotline for lesbian, gay, bisexual, transgender, or questioning youth.  Contact a crisis center or a local suicide prevention center. To find a crisis center or suicide prevention center:  Call your local hospital, clinic, community service organization, mental health center, social service provider, or health department. Ask for assistance in connecting to a crisis center.  Visit https://www.patel-king.com/www.suicidepreventionlifeline.org/getinvolved/locator for a list of crisis centers in the [place], or visit www.suicideprevention.ca/thinking-about-suicide/find-a-crisis-centre for a list of centers in [place].  Visit the following websites:  National Suicide Prevention Lifeline: www.suicidepreventionlifeline.org  Hopeline: www.hopeline.com  [employer] for Suicide Prevention: https://www.ayers.com/www.afsp.org  The [employer] Project (for lesbian, gay, bisexual, transgender, or questioning youth): www.thetrevorproject.org HOW CAN I HELP MYSELF FEEL BETTER?  Promise yourself that you will not do anything drastic when you have suicidal feelings. Remember, there is hope. Many people have gotten through suicidal thoughts and feelings, and you will, too. You may have gotten through them before, and this proves that you can get through them again.  Let family, friends, teachers, or counselors know how you are feeling. Try not to isolate yourself from those who care about you. Remember, they will want to help you. Talk with someone every day, even if you do not feel sociable. Face-to-face conversation is best.  Call a mental health professional and see one regularly.  Visit your primary health care provider every  year.  Eat a well-balanced diet, and space your meals so you eat regularly.  Get plenty of rest.  Avoid alcohol and drugs, and remove them from your home. They will only make you feel worse.  If you are thinking of  taking a lot of medicine, give your medicine to someone who can give it to you one day at a time. If you are on antidepressants and are concerned you will overdose, let your health care provider know so he or she can give you safer medicines. Ask your mental health professional about the possible side effects of any medicines you are taking.  Remove weapons, poisons, knives, and anything else that could harm you from your home.  Try to stick to routines. Follow a schedule every day. Put self-care on your schedule.  Make a list of realistic goals, and cross them off when you achieve them. Accomplishments give a sense of worth.  Wait until you are feeling better before doing the things you find difficult or unpleasant.  Exercise if you are able. You will feel better if you exercise for even a half hour each day.  Go out in the sun or into nature. This will help you recover from depression faster. If you have a favorite place to walk, go there.  Do the things that have always given you pleasure. Play your favorite music, read a good book, paint a picture, play your favorite instrument, or do anything else that takes your mind off your depression if it is safe to do.  Keep your living space well lit.  When you are feeling well, write yourself a letter about tips and support that you can read when you are not feeling well.  Remember that life's difficulties can be sorted out with help. Conditions can be treated. You can work on thoughts and strategies that serve you well.   This information is not intended to replace advice given to you by your health care provider. Make sure you discuss any questions you have with your health care provider.   Document Released: 05/26/2003  Document Revised: 12/10/2014 Document Reviewed: 03/16/2014 Elsevier Interactive Patient Education [employer]

## 2015-12-15 NOTE — ED Notes (Signed)
Supper provided along with an extra drink  Pt observed with no unusual behavior  Appropriate to stimulation  No verbalized needs or concerns at this time  NAD assessed  Continue to monitor 

## 2015-12-15 NOTE — ED Notes (Signed)
Pt dressed out and clothes sent home with mother

## 2015-12-15 NOTE — ED Notes (Signed)
PT  VOL  SEEN  BY  DR  CLAPACS  PENDING  D/C  HOME
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
## 2015-12-15 NOTE — ED Notes (Signed)
Pt states she is having thoughts of not wanting to live, states hx of SI attempts, no plan now, pt calm and cooperative, denies HI, denies any drug use or ETOH, mother with pt

## 2015-12-30 ENCOUNTER — Emergency Department (HOSPITAL_COMMUNITY)
Admission: EM | Admit: 2015-12-30 | Discharge: 2015-12-31 | Disposition: A | Discharge status: Other Acute Inpatient Hospital | Payer: Self-pay | Attending: Emergency Medicine | Admitting: Emergency Medicine

## 2015-12-30 ENCOUNTER — Encounter (HOSPITAL_COMMUNITY): Payer: Self-pay

## 2015-12-30 DIAGNOSIS — Z880 Allergy status to penicillin: Secondary | ICD-10-CM | POA: Insufficient documentation

## 2015-12-30 DIAGNOSIS — F1510 Other stimulant abuse, uncomplicated: Secondary | ICD-10-CM | POA: Insufficient documentation

## 2015-12-30 DIAGNOSIS — F1011 Alcohol abuse, in remission: Secondary | ICD-10-CM

## 2015-12-30 DIAGNOSIS — Z91040 Latex allergy status: Secondary | ICD-10-CM | POA: Insufficient documentation

## 2015-12-30 DIAGNOSIS — F1021 Alcohol dependence, in remission: Secondary | ICD-10-CM | POA: Insufficient documentation

## 2015-12-30 DIAGNOSIS — R45851 Suicidal ideations: Secondary | ICD-10-CM

## 2015-12-30 DIAGNOSIS — F17210 Nicotine dependence, cigarettes, uncomplicated: Secondary | ICD-10-CM | POA: Insufficient documentation

## 2015-12-30 DIAGNOSIS — Z87442 Personal history of urinary calculi: Secondary | ICD-10-CM | POA: Insufficient documentation

## 2015-12-30 DIAGNOSIS — F1310 Sedative, hypnotic or anxiolytic abuse, uncomplicated: Secondary | ICD-10-CM | POA: Insufficient documentation

## 2015-12-30 DIAGNOSIS — F331 Major depressive disorder, recurrent, moderate: Secondary | ICD-10-CM | POA: Insufficient documentation

## 2015-12-30 DIAGNOSIS — Z3202 Encounter for pregnancy test, result negative: Secondary | ICD-10-CM | POA: Insufficient documentation

## 2015-12-30 DIAGNOSIS — F1920 Other psychoactive substance dependence, uncomplicated: Secondary | ICD-10-CM | POA: Diagnosis present

## 2015-12-30 DIAGNOSIS — Z79899 Other long term (current) drug therapy: Secondary | ICD-10-CM | POA: Insufficient documentation

## 2015-12-30 HISTORY — DX: Alcohol abuse, uncomplicated: F10.10

## 2015-12-30 HISTORY — DX: Other symptoms and signs involving appearance and behavior: R46.89

## 2015-12-30 HISTORY — DX: Suicidal ideations: R45.851

## 2015-12-30 LAB — CBC
HCT: 37.3 % (ref 36.0–46.0)
Hemoglobin: 12.8 g/dL (ref 12.0–15.0)
MCH: 31.9 pg (ref 26.0–34.0)
MCHC: 34.3 g/dL (ref 30.0–36.0)
MCV: 93 fL (ref 78.0–100.0)
PLATELETS: 498 10*3/uL — AB (ref 150–400)
RBC: 4.01 MIL/uL (ref 3.87–5.11)
RDW: 12.4 % (ref 11.5–15.5)
WBC: 12.8 10*3/uL — ABNORMAL HIGH (ref 4.0–10.5)

## 2015-12-30 LAB — RAPID URINE DRUG SCREEN, HOSP PERFORMED
Amphetamines: POSITIVE — AB
Barbiturates: POSITIVE — AB
Benzodiazepines: POSITIVE — AB
Cocaine: NOT DETECTED
OPIATES: NOT DETECTED
Tetrahydrocannabinol: NOT DETECTED

## 2015-12-30 LAB — COMPREHENSIVE METABOLIC PANEL
ALT: 14 U/L (ref 14–54)
AST: 20 U/L (ref 15–41)
Albumin: 4.6 g/dL (ref 3.5–5.0)
Alkaline Phosphatase: 69 U/L (ref 38–126)
Anion gap: 12 (ref 5–15)
BILIRUBIN TOTAL: 0.5 mg/dL (ref 0.3–1.2)
BUN: 14 mg/dL (ref 6–20)
CALCIUM: 9.4 mg/dL (ref 8.9–10.3)
CHLORIDE: 106 mmol/L (ref 101–111)
CO2: 24 mmol/L (ref 22–32)
CREATININE: 0.55 mg/dL (ref 0.44–1.00)
Glucose, Bld: 95 mg/dL (ref 65–99)
Potassium: 3.8 mmol/L (ref 3.5–5.1)
Sodium: 142 mmol/L (ref 135–145)
TOTAL PROTEIN: 7.7 g/dL (ref 6.5–8.1)

## 2015-12-30 LAB — ACETAMINOPHEN LEVEL: Acetaminophen (Tylenol), Serum: 12 ug/mL (ref 10–30)

## 2015-12-30 LAB — I-STAT BETA HCG BLOOD, ED (MC, WL, AP ONLY)

## 2015-12-30 LAB — SALICYLATE LEVEL

## 2015-12-30 LAB — ETHANOL

## 2015-12-30 MED ORDER — LORAZEPAM 1 MG PO TABS
1.0000 mg | ORAL_TABLET | Freq: Once | ORAL | Status: AC
  Administered 2015-12-30: 1 mg via ORAL
  Filled 2015-12-30: qty 1

## 2015-12-30 MED ORDER — IBUPROFEN 200 MG PO TABS
600.0000 mg | ORAL_TABLET | Freq: Once | ORAL | Status: AC
  Administered 2015-12-30: 600 mg via ORAL
  Filled 2015-12-30: qty 3

## 2015-12-30 NOTE — BH Assessment (Signed)
Assessment completed. Consulted Ijeoma Nwaeze, NP who recommended that pt be evaluated by psychiatry in the morning.  
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
## 2015-12-30 NOTE — ED Notes (Signed)
Pt. C/o "kidney pain". Pitcher of ice water given with advice to hydrate.
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
## 2015-12-30 NOTE — ED Notes (Signed)
Report received from [facility] . Pt. Sleeping, respirations regular and unlabored. Will continue to monitor for safety via security cameras and Q 15 minute checks.

## 2015-12-30 NOTE — ED Notes (Signed)
Pt oriented to room and unit.  Patient contracts for safety.  15 minute checks and video monitoring in place.

## 2015-12-30 NOTE — ED Notes (Signed)
Pt. Noted sleeping in room. No complaints or concerns voiced. No distress or abnormal behavior noted. Will continue to monitor with security cameras. Q 15 minute rounds continue. 

## 2015-12-30 NOTE — ED Notes (Signed)
PA at bedside.

## 2015-12-30 NOTE — ED Provider Notes (Signed)
CSN: [id number]     Arrival date & time 12/30/15  1313 History   First MD Initiated Contact with Patient 12/30/15 1412     Chief Complaint  Patient presents with  . Suicidal     (Consider location/radiation/quality/duration/timing/severity/associated sxs/prior Treatment) HPI Patient presents to the emergency department with suicidal ideation, along with depression.  This been increasing over the last several months.  Patient states that she has been having worsening symptoms.  She states she is not having any hallucinations, homicidal ideation, chest pain, shortness of breath, weakness, dizziness, headache, blurred vision, back pain, neck pain, fever, dysuria, incontinence, abdominal pain, nausea, vomiting, diarrhea, or syncope.  Patient states nothing seems make her condition better or worse Past Medical History  Diagnosis Date  . Kidney stone   . Depression   . Suicidal ideation   . Aggression   . Alcohol consumption binge drinking    Past Surgical History  Procedure Laterality Date  . Tonsillectomy    . Wisdom tooth extraction     History reviewed. No pertinent family history. Social History  Substance Use Topics  . Smoking status: Current Every Day Smoker -- 1.00 packs/day    Types: Cigarettes  . Smokeless tobacco: None  . Alcohol Use: 3.0 oz/week    2 Cans of beer, 3 Shots of liquor per week     Comment: occasionally   OB History    No data available     Review of Systems  All other systems negative except as documented in the HPI. All pertinent positives and negatives as reviewed in the HPI.  Allergies  Latex and Amoxicillin  Home Medications   Prior to Admission medications   Medication Sig Start Date End Date Taking? Authorizing Provider  aspirin-acetaminophen-caffeine (EXCEDRIN MIGRAINE) [id number] MG tablet Take 1-2 tablets by mouth every 6 (six) hours as needed for headache.   Yes Historical Provider, MD  Doxylamine Succinate, Sleep, (SLEEP AID PO) Take 3  tablets by mouth at bedtime.   Yes Historical Provider, MD  FLUoxetine (PROZAC) 40 MG capsule Take 40 mg by mouth daily. 12/15/15  Yes Historical Provider, MD  oxyCODONE (OXY IR/ROXICODONE) 5 MG immediate release tablet Take 5 mg by mouth every 4 (four) hours as needed for moderate pain. for pain 12/23/15  Yes Historical Provider, MD  FLUoxetine (PROZAC) 10 MG capsule Take 1 capsule (10 mg total) by mouth daily. 12/15/15   Audery Amel, MD   BP 131/86 mmHg  Pulse 117  Temp(Src) 98.3 F (36.8 C) (Oral)  Resp 19  SpO2 99%  LMP 12/08/2015 Physical Exam  Constitutional: She is oriented to person, place, and time. She appears well-developed and well-nourished. No distress.  HENT:  Head: Normocephalic and atraumatic.  Mouth/Throat: Oropharynx is clear and moist.  Eyes: Pupils are equal, round, and reactive to light.  Neck: Normal range of motion. Neck supple.  Cardiovascular: Normal rate, regular rhythm and normal heart sounds.  Exam reveals no gallop and no friction rub.   No murmur heard. Pulmonary/Chest: Effort normal and breath sounds normal. No respiratory distress. She has no wheezes.  Abdominal: Soft. Bowel sounds are normal. She exhibits no distension. There is no tenderness.  Neurological: She is alert and oriented to person, place, and time. She exhibits normal muscle tone. Coordination normal.  Skin: Skin is warm and dry. No rash noted. No erythema.  Psychiatric: She has a normal mood and affect. Her behavior is normal. She is not agitated, not slowed and not actively hallucinating. Thought  content is not paranoid and not delusional. She expresses suicidal ideation. She expresses no homicidal ideation. She expresses no suicidal plans and no homicidal plans.  Nursing note and vitals reviewed.   ED Course  Procedures (including critical care time) Labs Review Labs Reviewed  COMPREHENSIVE METABOLIC PANEL  ETHANOL  SALICYLATE LEVEL  ACETAMINOPHEN LEVEL  CBC  URINE RAPID DRUG  SCREEN, HOSP PERFORMED  I-STAT BETA HCG BLOOD, ED ([facility], [facility], [facility] ONLY)    Imaging Review No results found. I have personally reviewed and evaluated these images and lab results as part of my medical decision-making.  Patient suicidal and will need TTS assessment further evaluation and care for her issue.  Patient is made aware of the plan and all questions were answered    Charlestine Night, PA-C 12/30/15 1441  Loren Racer, MD 12/30/15 1537

## 2015-12-31 ENCOUNTER — Inpatient Hospital Stay (HOSPITAL_COMMUNITY)
Admission: AD | Admit: 2015-12-31 | Discharge: 2016-01-05 | DRG: 885 | Disposition: A | Discharge status: Home / Self Care | Payer: No Typology Code available for payment source | Source: Intra-hospital | Attending: Psychiatry | Admitting: Psychiatry

## 2015-12-31 ENCOUNTER — Encounter (HOSPITAL_COMMUNITY): Payer: Self-pay | Admitting: *Deleted

## 2015-12-31 DIAGNOSIS — G4700 Insomnia, unspecified: Secondary | ICD-10-CM | POA: Diagnosis present

## 2015-12-31 DIAGNOSIS — F410 Panic disorder [episodic paroxysmal anxiety] without agoraphobia: Secondary | ICD-10-CM | POA: Diagnosis present

## 2015-12-31 DIAGNOSIS — F1010 Alcohol abuse, uncomplicated: Secondary | ICD-10-CM

## 2015-12-31 DIAGNOSIS — F332 Major depressive disorder, recurrent severe without psychotic features: Secondary | ICD-10-CM | POA: Diagnosis present

## 2015-12-31 DIAGNOSIS — Z23 Encounter for immunization: Secondary | ICD-10-CM | POA: Diagnosis not present

## 2015-12-31 DIAGNOSIS — F1020 Alcohol dependence, uncomplicated: Secondary | ICD-10-CM | POA: Diagnosis present

## 2015-12-31 DIAGNOSIS — F909 Attention-deficit hyperactivity disorder, unspecified type: Secondary | ICD-10-CM | POA: Diagnosis present

## 2015-12-31 DIAGNOSIS — R45851 Suicidal ideations: Secondary | ICD-10-CM | POA: Diagnosis present

## 2015-12-31 DIAGNOSIS — K0889 Other specified disorders of teeth and supporting structures: Secondary | ICD-10-CM | POA: Diagnosis present

## 2015-12-31 DIAGNOSIS — Z818 Family history of other mental and behavioral disorders: Secondary | ICD-10-CM | POA: Diagnosis not present

## 2015-12-31 DIAGNOSIS — F902 Attention-deficit hyperactivity disorder, combined type: Secondary | ICD-10-CM | POA: Diagnosis not present

## 2015-12-31 DIAGNOSIS — F331 Major depressive disorder, recurrent, moderate: Secondary | ICD-10-CM

## 2015-12-31 DIAGNOSIS — F17210 Nicotine dependence, cigarettes, uncomplicated: Secondary | ICD-10-CM | POA: Diagnosis present

## 2015-12-31 DIAGNOSIS — F1920 Other psychoactive substance dependence, uncomplicated: Secondary | ICD-10-CM | POA: Diagnosis present

## 2015-12-31 DIAGNOSIS — F3181 Bipolar II disorder: Secondary | ICD-10-CM | POA: Diagnosis present

## 2015-12-31 MED ORDER — FLUOXETINE HCL 20 MG PO CAPS
40.0000 mg | ORAL_CAPSULE | Freq: Every day | ORAL | Status: DC
  Administered 2015-12-31: 40 mg via ORAL
  Filled 2015-12-31: qty 2

## 2015-12-31 MED ORDER — INFLUENZA VAC SPLIT QUAD 0.5 ML IM SUSY
0.5000 mL | PREFILLED_SYRINGE | INTRAMUSCULAR | Status: DC
  Filled 2015-12-31: qty 0.5

## 2015-12-31 MED ORDER — MAGNESIUM HYDROXIDE 400 MG/5ML PO SUSP: 30.0000 mL | Freq: Every day | ORAL | Status: DC | PRN

## 2015-12-31 MED ORDER — ACETAMINOPHEN 325 MG PO TABS
650.0000 mg | ORAL_TABLET | Freq: Once | ORAL | Status: AC
  Administered 2015-12-31: 650 mg via ORAL
  Filled 2015-12-31: qty 2

## 2015-12-31 MED ORDER — ALUM & MAG HYDROXIDE-SIMETH 200-200-20 MG/5ML PO SUSP: 30.0000 mL | ORAL | Status: DC | PRN

## 2015-12-31 MED ORDER — NICOTINE 21 MG/24HR TD PT24
21.0000 mg | MEDICATED_PATCH | Freq: Every day | TRANSDERMAL | Status: DC
  Administered 2015-12-31 – 2016-01-05 (×6): 21 mg via TRANSDERMAL
  Filled 2015-12-31 (×8): qty 1

## 2015-12-31 MED ORDER — HYDROXYZINE HCL 25 MG PO TABS
25.0000 mg | ORAL_TABLET | Freq: Four times a day (QID) | ORAL | Status: DC | PRN
  Administered 2016-01-02 – 2016-01-04 (×3): 25 mg via ORAL
  Filled 2015-12-31 (×3): qty 1
  Filled 2015-12-31: qty 10

## 2015-12-31 MED ORDER — PNEUMOCOCCAL VAC POLYVALENT 25 MCG/0.5ML IJ INJ
0.5000 mL | INJECTION | INTRAMUSCULAR | Status: AC
  Administered 2016-01-01: 0.5 mL via INTRAMUSCULAR

## 2015-12-31 MED ORDER — ACETAMINOPHEN 325 MG PO TABS
650.0000 mg | ORAL_TABLET | Freq: Four times a day (QID) | ORAL | Status: DC | PRN
  Administered 2016-01-01 – 2016-01-05 (×7): 650 mg via ORAL
  Filled 2015-12-31 (×7): qty 2

## 2015-12-31 MED ORDER — TRAZODONE HCL 50 MG PO TABS
50.0000 mg | ORAL_TABLET | Freq: Every evening | ORAL | Status: DC | PRN
Start: 2015-12-31 — End: 2016-01-01
  Administered 2015-12-31 (×2): 50 mg via ORAL
  Filled 2015-12-31 (×2): qty 1

## 2015-12-31 MED ORDER — FLUOXETINE HCL 20 MG PO CAPS
40.0000 mg | ORAL_CAPSULE | Freq: Every day | ORAL | Status: DC
  Administered 2016-01-01 – 2016-01-05 (×5): 40 mg via ORAL
  Filled 2015-12-31 (×2): qty 2
  Filled 2015-12-31: qty 14
  Filled 2015-12-31 (×4): qty 2
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
## 2015-12-31 NOTE — ED Notes (Signed)
[facility] contacted for transport 
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
## 2015-12-31 NOTE — ED Notes (Signed)
Pt. Noted sleeping in room. No complaints or concerns voiced. No distress or abnormal behavior noted. Will continue to monitor with security cameras. Q 15 minute rounds continue. 

## 2015-12-31 NOTE — ED Notes (Signed)
Pt is aware that she is going to be admitted to [facility]. Contact information given, HA has resolved. Pt to contact family and they will bring her clothes to [facility]

## 2015-12-31 NOTE — ED Notes (Signed)
On the phone 

## 2015-12-31 NOTE — Progress Notes (Signed)
Patient ID: Briana Davis, female   DOB: 26-Sep-1991, 25 y.o.   MRN: [id number] D: Client seen on the hall, reports regrets "I'm here voluntarily, I gotta change, time to grow up, I don't want to drink again" "I don't want to be [age] and coming here" "I was in jail and didn't have seventy-five dollars to get out, the judge let me out, that was God" I hurt my mother and everybody, but they still try to help me.  A: Writer provided emotional support encouraged client to continue to move forward with recovery. Reviewed medications, administered as prescribed. Staff will monitor q22min for safety. R: Client is safe on the unit, attended unit.
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
## 2015-12-31 NOTE — Progress Notes (Signed)
Patient did attend the evening speaker AA meeting.  

## 2015-12-31 NOTE — Tx Team (Addendum)
Initial Interdisciplinary Treatment Plan   PATIENT STRESSORS: Legal issue Marital or family conflict Occupational concerns Substance abuse   PATIENT STRENGTHS: Ability for insight Average or above average intelligence Communication skills General fund of knowledge Motivation for treatment/growth Physical Health Supportive family/friends Work skills   PROBLEM LIST: Problem List/Patient Goals Date to be addressed Date deferred Reason deferred Estimated date of resolution  "I want to fix the loose screw in my head to be the person I know I can be." 12/31/15           "I want to know about resources to help me stay healthy." 12/31/15           Polysubstance use/abuse 12/31/15                              DISCHARGE CRITERIA:  Improved stabilization in mood, thinking, and/or behavior Reduction of life-threatening or endangering symptoms to within safe limits Verbal commitment to aftercare and medication compliance Withdrawal symptoms are absent or subacute and managed without 24-hour nursing intervention  PRELIMINARY DISCHARGE PLAN: Attend 12-step recovery group Outpatient therapy Participate in family therapy Return to previous living arrangement Return to previous work or school arrangements  PATIENT/FAMIILY INVOLVEMENT: This treatment plan has been presented to and reviewed with the patient, Briana Davis, and/or family member.  The patient and family have been given the opportunity to ask questions and make suggestions.  Lawrence Marseilles 12/31/2015, 4:15 PM

## 2015-12-31 NOTE — ED Notes (Signed)
Pt ambulatory w/o difficulty to [facility] w/ [facility]

## 2015-12-31 NOTE — BH Assessment (Signed)
Patient accepted to [facility] by Dr. Lolly Mustache to bed 305-1. Patient reviewed and signed voluntary consent to treat. Patient states that she does not have any questions or concerns.  Patient signed ROI to no one and states that she will ask for another form if she changes her mind.   Paperwork faxed to [facility] and provided to patients nurse who will give to [facility] to transport with patient to [facility].  Davina Poke, LCSW Therapeutic Triage Specialist [facility] 12/31/2015 2:49 PM

## 2015-12-31 NOTE — ED Notes (Signed)
Dr Lolly Mustache and Catha Nottingham DNP into see

## 2015-12-31 NOTE — ED Notes (Signed)
Medicated for HA, nad, watching tv

## 2016-01-01 ENCOUNTER — Encounter (HOSPITAL_COMMUNITY): Payer: Self-pay | Admitting: Psychiatry

## 2016-01-01 DIAGNOSIS — F909 Attention-deficit hyperactivity disorder, unspecified type: Secondary | ICD-10-CM | POA: Diagnosis present

## 2016-01-01 DIAGNOSIS — F3181 Bipolar II disorder: Principal | ICD-10-CM

## 2016-01-01 DIAGNOSIS — F902 Attention-deficit hyperactivity disorder, combined type: Secondary | ICD-10-CM

## 2016-01-01 DIAGNOSIS — F1020 Alcohol dependence, uncomplicated: Secondary | ICD-10-CM

## 2016-01-01 MED ORDER — LAMOTRIGINE 25 MG PO TABS
25.0000 mg | ORAL_TABLET | Freq: Every day | ORAL | Status: DC
  Administered 2016-01-01 – 2016-01-05 (×5): 25 mg via ORAL
  Filled 2016-01-01 (×2): qty 1
  Filled 2016-01-01: qty 7
  Filled 2016-01-01 (×4): qty 1

## 2016-01-01 MED ORDER — TRAZODONE HCL 100 MG PO TABS
100.0000 mg | ORAL_TABLET | Freq: Every evening | ORAL | Status: DC | PRN
  Administered 2016-01-01 – 2016-01-04 (×4): 100 mg via ORAL
  Filled 2016-01-01 (×4): qty 1
  Filled 2016-01-01: qty 14

## 2016-01-01 MED ORDER — AMPHETAMINE-DEXTROAMPHET ER 10 MG PO CP24
10.0000 mg | ORAL_CAPSULE | Freq: Every day | ORAL | Status: DC
  Administered 2016-01-02: 10 mg via ORAL
  Filled 2016-01-01: qty 1

## 2016-01-01 NOTE — Plan of Care (Signed)
Problem: Alteration in mood & ability to function due to Goal: STG-Pt will be introduced to the 12-step program of recovery (Patient will be introduced to the 12-step program of recovery and disease concept of addiction)  Outcome: Progressing Client introduced to the 12-step recovery program AEB attending AA group tonight, "that group was really helpful, I got to make some changes" "I can't be coming in here at 25 years old" "I don't want keep drinking"

## 2016-01-01 NOTE — [facility] Group Notes (Signed)
[facility] Group Notes:  (Nursing/MHT/Case Management/Adjunct)  Date:  01/01/2016  Time:  1400  Type of Therapy:  Nurse Education  /  Healthy Support Systems: The group focuses on teaching patients how to develop healthy support systmets n  Participation Level:  Active  Participation Quality:  Appropriate  Affect:  Appropriate  Cognitive:  Alert  Insight:  Appropriate  Engagement in Group:  Engaged  Modes of Intervention:  Education  Summary of Progress/Problems:  Rich Brave 01/01/2016, 3:47 PM
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
## 2016-01-01 NOTE — Progress Notes (Signed)
Patient did attend the evening speaker AA meeting.  

## 2016-01-01 NOTE — [facility] Suicide Risk Assessment (Signed)
[facility] Admission Suicide Risk Assessment   Nursing information obtained from:  Patient, Review of record Demographic factors:  Adolescent or young adult, Caucasian, Unemployed Current Mental Status:   (per chart, pt expressed SI, pt denies) Loss Factors:  Decrease in vocational status, Legal issues Historical Factors:  Prior suicide attempts, Family history of mental illness or substance abuse Risk Reduction Factors:  Sense of responsibility to family, Living with another person, especially a relative, Positive therapeutic relationship  Total Time spent with patient: 45 minutes Principal Problem: Bipolar 2 disorder (HCC) Diagnosis:   Patient Active Problem List   Diagnosis Date Noted  . 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 Attention deficit hyperactivity disorder (ADHD) [F90.9] 01/01/2016  . Bipolar 2 disorder (HCC) [F31.81] 01/01/2016  . Polysubstance dependence (HCC) [F19.20] 12/31/2015  . Major depressive disorder, recurrent episode, severe (HCC) [F33.2] 12/31/2015  . Suicidal ideations [R45.851]   . Depression, major, recurrent, moderate (HCC) [F33.1] 12/15/2015  . Social anxiety disorder [F40.10] 12/15/2015  . 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 Suicidal ideation [R45.851] 12/15/2015   Subjective Data: see admission H and P  Continued Clinical Symptoms:  Alcohol Use Disorder Identification Test Final Score (AUDIT): 26 The "Alcohol Use Disorders Identification Test", Guidelines for Use in Primary Care, Second Edition.  World Science writer [facility]). Score between 0-7:  no or low risk or alcohol related problems. Score between 8-15:  moderate risk of alcohol related problems. Score between 16-19:  high risk of alcohol related problems. Score 20 or above:  warrants further diagnostic evaluation for alcohol dependence and treatment.   CLINICAL FACTORS:   Bipolar Disorder:   Bipolar II Alcohol/Substance Abuse/Dependencies More  than one psychiatric diagnosis   Psychiatric Specialty Exam: ROS  Blood pressure 118/73, pulse 113, temperature 98.4 F (36.9 C), temperature source Oral, resp. rate 20, height  (1.651 m), weight 64.864 kg (143 lb), last menstrual period 12/08/2015.Body mass index is 23.8 kg/(m^2).   COGNITIVE FEATURES THAT CONTRIBUTE TO RISK:  Closed-mindedness, Polarized thinking and Thought constriction (tunnel vision)    SUICIDE RISK:   Mild:  Suicidal ideation of limited frequency, intensity, duration, and specificity.  There are no identifiable plans, no associated intent, mild dysphoria and related symptoms, good self-control (both objective and subjective assessment), few other risk factors, and identifiable protective factors, including available and accessible social support.  PLAN OF CARE: see admission H and P  I certify that inpatient services furnished can reasonably be expected to improve the patient's condition.   Rachael Fee, MD 01/01/2016, 6:34 PM

## 2016-01-01 NOTE — [facility] Group Notes (Signed)
[facility] Group Notes:  (Clinical Social Work)  01/01/2016  10:00-11:00AM  Summary of Progress/Problems:   The main focus of today's process group was to   1)  discuss the various healthy and unhealthy supports that could potentially help meet the life needs identified in yesterday's group  2)  define healthy supports versus unhealthy supports  3)  identify potential methods to handle unhealthy supports and  4)  generate ideas on how patients can go about adding healthy supports in their lives  The patient expressed full comprehension of the concepts presented, and agreed that healthy supports can help in recovery while unhealthy supports contribute to lack of success.  The patient stated she is handing all of her money over to her sister in order to eliminate any potential problems with that triggering her to drink.  She participated very well and was insightful throughout group.  Type of Therapy:  Process Group with Motivational Interviewing  Participation Level:  Active  Participation Quality:  Attentive and Sharing  Affect:  Appropriate  Cognitive:  Appropriate  Insight:  Engaged  Engagement in Therapy:  Engaged  Modes of Intervention:   Education, Support and Processing, Activity  Ambrose Mantle, LCSW 01/01/2016
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
## 2016-01-01 NOTE — Progress Notes (Signed)
Briana Davis is seen OOB UAL on the 300 hall today...she tolerates this well. SHe is cooperative , pleasant and is adjusting to hospital routine. SHe takes her scheduled meds as planned and , she is seen sitting in th dayroom  , talking with her peers and says she is doing " ok".  A She completed her daily assessment and on it she wrote she deneid SI and she rated her depression, hopelessness and anxiety " 0/0/8", respectively. R Safety is in place.

## 2016-01-02 MED ORDER — AMPHETAMINE-DEXTROAMPHET ER 10 MG PO CP24
10.0000 mg | ORAL_CAPSULE | Freq: Two times a day (BID) | ORAL | Status: DC
  Administered 2016-01-02 – 2016-01-05 (×7): 10 mg via ORAL
  Filled 2016-01-02 (×7): qty 1
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
## 2016-01-02 NOTE — Progress Notes (Signed)
Pt has been observed in the dayroom talking and interacting with her peers this evening.  She denies SI/HI/AVH at this time.  She states she has been going to groups.  She wants to return home after discharge and would like to go to school to get her GED.  She says she needs to stay focused.  Pt denies any withdrawal symptoms at this time.  Discharge plans are in process.  Pt is pleasant and cooperative with staff.  Support and encouragement offered.  Pt makes her needs known to staff.  Safety maintained with q15 minute checks.

## 2016-01-02 NOTE — [facility] Suicide Risk Assessment (Signed)
[facility] INPATIENT:  Family/Significant Other Suicide Prevention Education  Suicide Prevention Education:  Contact Attempts: sister Anarely Nicholls [phone], (name of family member/significant other) has been identified by the patient as the family member/significant other with whom the patient will be residing, and identified as the person(s) who will aid the patient in the event of a mental health crisis.  With written consent from the patient, two attempts were made to provide suicide prevention education, prior to and/or following the patient's discharge.  We were unsuccessful in providing suicide prevention education.  A suicide education pamphlet was given to the patient to share with family/significant other.  Date and time of first attempt: 01/02/16 at 3:30pm Date and time of second attempt: 01/03/16 at 10:35am  Briana Davis, [place] 01/02/2016, 3:32 PM

## 2016-01-02 NOTE — Plan of Care (Signed)
Problem: Ineffective individual coping Goal: STG: Patient will remain free from self harm Outcome: Progressing Patient has not engaged in self harm, denies thoughts/urges to.  Problem: Alteration in mood & ability to function due to Goal: STG-Patient will comply with prescribed medication regimen (Patient will comply with prescribed medication regimen)  Outcome: Progressing Patient has been med compliant.

## 2016-01-02 NOTE — Progress Notes (Signed)
Pt attended spiritual care group on grief and loss facilitated by chaplain Naitik Hermann   Group opened with brief discussion and psycho-social ed around grief and loss in relationships and in relation to self - identifying life patterns, circumstances, changes that cause losses. Established group norm of speaking from own life experience. Group goal of establishing open and affirming space for members to share loss and experience with grief, normalize grief experience and provide psycho social education and grief support.     

## 2016-01-02 NOTE — [facility] Group Notes (Signed)
[facility] LCSW Group Therapy 01/02/2016  1:15 pm  Type of Therapy: Group Therapy Participation Level: Active  Participation Quality: Attentive, Sharing and Supportive  Affect: Appropriate  Cognitive: Alert and Oriented  Insight: Developing/Improving and Engaged  Engagement in Therapy: Developing/Improving and Engaged  Modes of Intervention: Clarification, Confrontation, Discussion, Education, Exploration,  Limit-setting, Orientation, Problem-solving, Rapport Building, Dance movement psychotherapist, Socialization and Support  Summary of Progress/Problems: Pt identified obstacles faced currently and processed barriers involved in overcoming these obstacles. Pt identified steps necessary for overcoming these obstacles and explored motivation (internal and external) for facing these difficulties head on. Pt further identified one area of concern in their lives and chose a goal to focus on for today. Patient identified abstaining from alcohol as her primary obstacle. She identifies her sister as supportive but reports that her mother is an alcoholic and a trigger for relapse. She reports that her plan for recovery includes staring a new job, going to school, and attending AA meetings. CSW and other group members provided patient with emotional support and encouragement.  Samuella Bruin, LCSW Clinical Social Worker [facility] [phone]

## 2016-01-02 NOTE — Progress Notes (Signed)
Adult Psychoeducational Group Note  Date:  01/02/2016 Time:  9:29 PM  Group Topic/Focus:  Wrap-Up Group:   The focus of this group is to help patients review their daily goal of treatment and discuss progress on daily workbooks.  Participation Level:  Active  Participation Quality:  Appropriate and Attentive  Affect:  Appropriate  Cognitive:  Appropriate  Insight: Appropriate and Good  Engagement in Group:  Engaged  Modes of Intervention:  Discussion  Additional Comments:  Pt rated her day a 8 out of 10. Pt goal for tomorrow is to take in everything tomorrow from the groups and learn all she can from the experience.   Merlinda Frederick 01/02/2016, 9:29 PM

## 2016-01-02 NOTE — Progress Notes (Signed)
Patient up and visible in milieu. Affect animated, appropriate with anxious mood. Reports HA of a 4/10 but denies other physical problems. Rates her depression and hopelessness both at a 0/10, anxiety is a 2.5/10. Denies withdrawal at this time. VSS. Medicated per orders. Given tylenol for HA. Offered emotional support and reassurance. Reviewed self inventory. On reassess, patient's HA is a 2/10. Denies SI/HI and remains safe on level III obs. Lawrence [place]

## 2016-01-02 NOTE — Tx Team (Addendum)
Interdisciplinary Treatment Plan Update (Adult) Date: 01/02/2016   Time Reviewed: 9:30 AM  Progress in Treatment: Attending groups: Yes Participating in groups: Yes Taking medication as prescribed: Yes Tolerating medication: Yes Family/Significant other contact made: CSW has attempted to contact sister Patient understands diagnosis: Yes Discussing patient identified problems/goals with staff: Yes Medical problems stabilized or resolved: Yes Denies suicidal/homicidal ideation: Yes Issues/concerns per patient self-inventory: Yes Other:  New problem(s) identified: N/A  Discharge Plan or Barriers: Home with outpatient services    Reason for Continuation of Hospitalization:  Depression Anxiety Medication Stabilization   Comments: N/A  Estimated length of stay: Discharge anticipated for 01/05/16    Patient is a 25yo female admitted with a diagnosis of Major Depressive Disorder and Alcohol Use Disorder, Moderate. Patient presented to the hospital at the recommendation of the judge in her court case and reports primary trigger for admission was a court order and her desire to change for the better. Patient will benefit from crisis stabilization, medication evaluation, group therapy and psychoeducation, in addition to case management for discharge planning. At discharge it is recommended that Patient adhere to the established discharge plan and continue in treatment.    Review of initial/current patient goals per problem list:  1. Goal(s): Patient will participate in aftercare plan   Met: Yes   Target date: 3-5 days post admission date   As evidenced by: Patient will participate within aftercare plan AEB aftercare provider and housing plan at discharge being identified.  1/30: Goal met. Patient plans to return home to follow up with outpatient services.    2. Goal (s): Patient will exhibit decreased depressive symptoms and suicidal ideations.   Met: Yes   Target  date: 3-5 days post admission date   As evidenced by: Patient will utilize self rating of depression at 3 or below and demonstrate decreased signs of depression or be deemed stable for discharge by MD.  1/30: Goal met. Patient rates depression at 0, denies SI.    3. Goal(s): Patient will demonstrate decreased signs and symptoms of anxiety.   Met: Yes   Target date: 3-5 days post admission date   As evidenced by: Patient will utilize self rating of anxiety at 3 or below and demonstrated decreased signs of anxiety, or be deemed stable for discharge by MD  1/30: Goal met. Patient rates anxiety at 2.    4. Goal(s): Patient will demonstrate decreased signs of withdrawal due to substance abuse   Met: Yes   Target date: 3-5 days post admission date   As evidenced by: Patient will produce a CIWA/COWS score of 0, have stable vitals signs, and no symptoms of withdrawal  1/30: Goal met. No withdrawal symptoms reported at this time per medical chart.    Attendees: Patient:    Family:    Physician: Dr. Parke Poisson; Dr. Sabra Heck 01/02/2016 9:30 AM  Nursing:  Marcella Dubs, [address] Christa Schuyler Amor, RN 01/02/2016 9:30 AM  Clinical Social Worker: Tilden Fossa, LCSW 01/02/2016 9:30 AM  Other: Peri Maris, LCSWA; [place], LCSW  01/02/2016 9:30 AM   01/02/2016 9:30 AM   01/02/2016 9:30 AM  Other:  Andria Rhein, NP 01/02/2016 9:30 AM  Other:      Scribe for Treatment Team:  Tilden Fossa, [place]

## 2016-01-02 NOTE — [facility] Group Notes (Signed)
[facility] LCSW Aftercare Discharge Planning Group Note   01/02/2016 10:46 AM  Participation Quality:  Appropriate   Mood/Affect:  Appropriate  Depression Rating:  0  Anxiety Rating:  2  Thoughts of Suicide:  No Will you contract for safety?   NA  Current AVH:  No  Plan for Discharge/Comments:  Pt reports that she plans to return home with her mother and sister. She has hx of o/p treatment at [facility] in [place] but would like information to read about [employer] before committing to an o/p provider. Pt also requesting AA list for [employer]. She reports good sleep and no withdrawals today. "I need to leave by Thursday because I have court on Friday."   Transportation Means: parent  Supports: mother/sister/family supports   Counselling psychologist, Conservation officer, nature

## 2016-01-03 MED ORDER — BENZOCAINE 10 % MT GEL
Freq: Three times a day (TID) | OROMUCOSAL | Status: DC | PRN
  Administered 2016-01-03 – 2016-01-05 (×3): via OROMUCOSAL
  Filled 2016-01-03: qty 9.4

## 2016-01-03 MED ORDER — IBUPROFEN 600 MG PO TABS
600.0000 mg | ORAL_TABLET | Freq: Four times a day (QID) | ORAL | Status: DC | PRN
  Administered 2016-01-03: 600 mg via ORAL
  Filled 2016-01-03: qty 1

## 2016-01-03 NOTE — Progress Notes (Signed)
Patient ID: Briana Davis, female   DOB: [birth year], 24 y.o.   MRN: [id number] [facility] MD Progress Note  01/03/2016 4:08 PM Briana Davis  MRN:  [id number]  Subjective:  Briana Davis states that she is feeling a lot of guilt because she has been manipulating her family, saying all the right things she wants them to hear, but will turn around & do the opposite. She says she has been feeling hopeless due to her alcoholism. However, she says this is a new day to start being realistic & positive about her future. She plans to continue the AA weekly meetings after discharge. Has a new job she will start next Tuesday to occupy her most vunerable times of the day. She will enroll in school for her GED diploma. However, she continues to worry about the panic attacks, the sense of feeling doomed. Could it be the reason for her alcoholism since the age of 42, she wondered? She is participating in the group mileu. Briana Davis appears to be in no apparent distress.  Principal Problem: Bipolar 2 disorder (HCC)  Diagnosis:   Patient Active Problem List   Diagnosis Date Noted  . 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 Attention deficit hyperactivity disorder (ADHD) [F90.9] 01/01/2016  . Bipolar 2 disorder (HCC) [F31.81] 01/01/2016  . Polysubstance dependence (HCC) [F19.20] 12/31/2015  . Suicidal ideations [R45.851]   . Depression, major, recurrent, moderate (HCC) [F33.1] 12/15/2015  . Social anxiety disorder [F40.10] 12/15/2015  . 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 Suicidal ideation [R45.851] 12/15/2015   Total Time spent with patient: 15 minutes  Past Psychiatric History: see admission H and P  Past Medical History:  Past Medical History  Diagnosis Date  . Kidney stone   . Depression   . Suicidal ideation   . Aggression   . Alcohol consumption binge drinking     Past Surgical History  Procedure Laterality Date  . Tonsillectomy    . Wisdom tooth extraction      Family History: History reviewed. No pertinent family history.  Family Psychiatric  History: See admission H and P  Social History:  History  Alcohol Use  . 3.0 oz/week  . 2 Cans of beer, 3 Shots of liquor per week    Comment: occasionally     History  Drug Use No    Social History   Social History  . Marital Status: Single    Spouse Name: N/A  . Number of Children: N/A  . Years of Education: N/A   Social History Main Topics  . Smoking status: Current Every Day Smoker -- 1.00 packs/day    Types: Cigarettes  . Smokeless tobacco: None  . Alcohol Use: 3.0 oz/week    2 Cans of beer, 3 Shots of liquor per week     Comment: occasionally  . Drug Use: No  . Sexual Activity: Not Currently   Other Topics Concern  . None   Social History Narrative   Additional Social History:   Sleep: Good  Appetite:  Fair  Current Medications: Current Facility-Administered Medications  Medication Dose Route Frequency Provider Last Rate Last Dose  . acetaminophen (TYLENOL) tablet 650 mg  650 mg Oral Q6H PRN Charm Rings, NP   650 mg at 01/03/16 0929  . alum & mag hydroxide-simeth (MAALOX/MYLANTA) 200-200-20 MG/5ML suspension 30 mL  30 mL Oral Q4H PRN Charm Rings, NP      . amphetamine-dextroamphetamine (ADDERALL XR) 24 hr capsule 10  mg  10 mg Oral BID Rachael Fee, MD   10 mg at 01/03/16 1301  . benzocaine (ORAJEL) 10 % mucosal gel   Mouth/Throat TID PRN Sanjuana Kava, NP      . FLUoxetine (PROZAC) capsule 40 mg  40 mg Oral Daily Charm Rings, NP   40 mg at 01/03/16 0751  . hydrOXYzine (ATARAX/VISTARIL) tablet 25 mg  25 mg Oral Q6H PRN Rachael Fee, MD   25 mg at 01/02/16 1848  . ibuprofen (ADVIL,MOTRIN) tablet 600 mg  600 mg Oral Q6H PRN Sanjuana Kava, NP      . Influenza vac split quadrivalent PF (FLUARIX) injection 0.5 mL  0.5 mL Intramuscular Tomorrow-1000 Rachael Fee, MD   0.5 mL at 01/01/16 1719  . lamoTRIgine (LAMICTAL) tablet 25 mg  25 mg Oral Daily Rachael Fee, MD    25 mg at 01/03/16 1610  . magnesium hydroxide (MILK OF MAGNESIA) suspension 30 mL  30 mL Oral Daily PRN Charm Rings, NP      . nicotine (NICODERM CQ - dosed in mg/24 hours) patch 21 mg  21 mg Transdermal Daily Rachael Fee, MD   21 mg at 01/03/16 0753  . traZODone (DESYREL) tablet 100 mg  100 mg Oral QHS PRN Rachael Fee, MD   100 mg at 01/02/16 2147   Lab Results: No results found for this or any previous visit (from the past 48 hour(s)).  Physical Findings: AIMS: Facial and Oral Movements Muscles of Facial Expression: None, normal Lips and Perioral Area: None, normal Jaw: None, normal Tongue: None, normal,Extremity Movements Upper (arms, wrists, hands, fingers): None, normal Lower (legs, knees, ankles, toes): None, normal, Trunk Movements Neck, shoulders, hips: None, normal, Overall Severity Severity of abnormal movements (highest score from questions above): None, normal Incapacitation due to abnormal movements: None, normal Patient's awareness of abnormal movements (rate only patient's report): No Awareness, Dental Status Current problems with teeth and/or dentures?: No Does patient usually wear dentures?: No  CIWA:  CIWA-Ar Total: 2 COWS:     Musculoskeletal: Strength & Muscle Tone: within normal limits Gait & Station: normal Patient leans: normal  Psychiatric Specialty Exam: Review of Systems  Constitutional: Negative.   HENT: Negative.   Eyes: Negative.   Respiratory: Negative.   Cardiovascular: Negative.   Gastrointestinal: Negative.   Genitourinary: Negative.   Musculoskeletal: Negative.   Skin: Negative.   Neurological: Negative.   Endo/Heme/Allergies: Negative.   Psychiatric/Behavioral: Positive for depression. The patient is nervous/anxious.     Blood pressure 104/70, pulse 114, temperature 98.6 F (37 C), temperature source Oral, resp. rate 16, height  (1.651 m), weight 64.864 kg (143 lb), last menstrual period 12/08/2015.Body mass index is 23.8  kg/(m^2).  General Appearance: Fairly Groomed  Patent attorney::  Fair  Speech:  Clear and Coherent  Volume:  Normal  Mood:  Anxious and worried  Affect:  anxious worried  Thought Process:  Coherent and Goal Directed  Orientation:  Full (Time, Place, and Person)  Thought Content:  symptoms events worries concerns   Suicidal Thoughts:  No  Homicidal Thoughts:  No  Memory:  Immediate;   Fair Recent;   Fair Remote;   Fair  Judgement:  Fair  Insight:  Present and Shallow  Psychomotor Activity:  Restlessness  Concentration:  Fair  Recall:  [employer] of Knowledge:Fair  Language: Fair  Akathisia:  No  Handed:  Right  AIMS (if indicated):     Assets:  Desire for Improvement Housing Social Support  ADL's:  Intact  Cognition: WNL  Sleep:  Number of Hours: 6.5   Treatment Plan Summary: Daily contact with patient to assess and evaluate symptoms and progress in treatment and Medication management Supportive approach/coping skills Alcohol dependence; work a relapse prevention plan Depression; Continue the Prozac 40 mg daily Inattentiveness/distractibility; Continue Adderall 10 mg BID Mood instability; will continue the Lamictal 25 mg Work with CBT/mindfulness. Tooth pain: initiate Ibuprofen 600 mg prn & Orajel application tid. Social worker to work on disposition.  Armandina Stammer I, PMHMP, FNP-BC 01/03/2016, 4:08 PM

## 2016-01-03 NOTE — Progress Notes (Signed)
Recreation Therapy Notes  Animal-Assisted Activity (AAA) Program Checklist/Progress Notes Patient Eligibility Criteria Checklist & Daily Group note for Rec Tx Intervention  Date: 01.31.2017 Time: 2:45pm Location: [address] [employer]   AAA/T Program Assumption of Risk Form signed by Patient/ or Parent Legal Guardian yes  Patient is free of allergies or sever asthma yes  Patient reports no fear of animals yes  Patient reports no history of cruelty to animals yes  Patient understands his/her participation is voluntary yes  Behavioral Response: Did not attend.   Marykay Lex Elienai Gailey, LRT/CTRS  Sky Borboa L 01/03/2016 3:22 PM

## 2016-01-03 NOTE — [facility] Group Notes (Signed)
[facility] LCSW Group Therapy  01/03/2016   1:15 PM   Type of Therapy:  Group Therapy  Participation Level:  Active  Participation Quality:  Attentive, Sharing and Supportive  Affect:  Appropriate  Cognitive:  Alert and Oriented  Insight:  Developing/Improving and Engaged  Engagement in Therapy:  Developing/Improving and Engaged  Modes of Intervention:  Clarification, Confrontation, Discussion, Education, Exploration, Limit-setting, Orientation, Problem-solving, Rapport Building, Dance movement psychotherapist, Socialization and Support  Summary of Progress/Problems: The topic for group therapy was feelings about diagnosis.  Pt actively participated in group discussion on their past and current diagnosis and how they feel towards this.  Pt also identified how society and family members judge them, based on their diagnosis as well as stereotypes and stigmas.  Patient discussed how she and her sister view mental illness differently in that her sister does not feel that it is a disease. CSW and other group members provided patient with emotional support and encouragement.  Samuella Bruin, MSW, LCSW Clinical Social Worker [facility] [phone]

## 2016-01-03 NOTE — [facility] Group Notes (Signed)
[facility] Group Notes:  (Nursing/MHT/Case Management/Adjunct)  Date:  01/03/2016  Time:  0930  Type of Therapy:  Nurse Education  Participation Level:  Active  Participation Quality:  Appropriate and Attentive  Affect:  Appropriate  Cognitive:  Alert and Appropriate  Insight:  Good  Engagement in Group:  Engaged  Modes of Intervention:  Activity, Clarification and Discussion  Summary of Progress/Problems:  Dara Hoyer 01/03/2016, 11:01 AM

## 2016-01-03 NOTE — Progress Notes (Signed)
Adult Psychoeducational Group Note  Date:  01/03/2016 Time:  9:43 PM  Group Topic/Focus:  Wrap-Up Group:   The focus of this group is to help patients review their daily goal of treatment and discuss progress on daily workbooks.  Participation Level:  Active  Participation Quality:  Appropriate  Affect:  Appropriate  Cognitive:  Alert  Insight: Appropriate  Engagement in Group:  Engaged  Modes of Intervention:  Discussion  Additional Comments: Patient goal for today was to remain positive and to take in what she learns from each group.  On a scale between 1-10 (1=worst, 10=best) patient rated her day as a 10, "besides my tooth hurting".   Briana Davis [name] 01/03/2016, 9:43 PM

## 2016-01-03 NOTE — Progress Notes (Signed)
D:Per patient self inventory form pt reports she slept good with the use of sleep medication. She reports a good appetite, normal energy level, good concentration. Pt rates depression 0/10, hopelessness 0/10, anxiety 2-3, all on 0-10 scale, 10 being the worse. Pt denies SI/HI. Denies AVH. Pt reports her goal is "To continue to be in all groups and take in every information possible and keep staying positive." Pt reports she will meet her goal by "groups and communicate with my family" Pt denies s/s of withdrawal. Pt c/o tooth pain.   A:Special checks q 15 mins in place for safety. Medication administered per MD order(See eMAR) Encouragement and support provided. FNP notified of pt c/o tooth pain.   R:Safety maintained. Compliant with medication regimen. Will continue to monitor.

## 2016-01-03 NOTE — Progress Notes (Signed)
Pt reports that she has had a fairly good day.  She did report that a new female patient on the hall was making her feel nervous and anxious, but that she was able to come to staff for help and reassurance.  Pt denies SI/HI/AVH.  She denies any withdrawal symptoms at this time.  She reports that she will be discharged by Thursday as she has a court date on Friday.  She feels really bad about the situation surrounding the court date, and intends to stay away from alcohol.  She says her sister is a great support.  She wants to get her GED then go to college to get a degree and make a better life for herself.  Pt makes her needs known to staff.  Discharge plans are in process.  Support and encouragement offered.  Safety maintained with q15 minute checks.

## 2016-01-04 MED ORDER — IBUPROFEN 800 MG PO TABS
800.0000 mg | ORAL_TABLET | Freq: Four times a day (QID) | ORAL | Status: DC | PRN
  Administered 2016-01-04 – 2016-01-05 (×2): 800 mg via ORAL
  Filled 2016-01-04 (×2): qty 1

## 2016-01-04 NOTE — Progress Notes (Signed)
Pt reports she had a good day.  She attend groups and feels her medications are working for her.  She is worried a little about discharge which is for Wed or Thurs.  She wants to stay sober this time.  She wants to get her GED and then go to school for a degree.  She says her sister is her main support although they don't always see eye to eye on things.  She received medication for her tooth pain.  She is also worried about her court date on Friday, but wants to get it over with.  She has been pleasant and cooperative with staff.  She was observed in the dayroom watching TV and interacting appropriately with her peers.  Support and encouragement offered.  Safety maintained with q15 minute checks.

## 2016-01-04 NOTE — Progress Notes (Signed)
Briana Davis states her goal today was to stay positive and to take everything in that was not negative. She rates anxiety 8/10 as she is very anxious about going home and relapsing. Depression and Hopelessness 0/10. Her plan will be to stay with her sister who is a Armed forces operational officer and very supportive. Her mother is also living there. 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 She denies SI/HI/AVH and contracts for safety. Encouragement and support given. Medications administered as prescribed. Continue to monitor Q 15 minutes for patient safety and medication effectiveness.

## 2016-01-04 NOTE — [facility] Group Notes (Signed)
   [facility] LCSW Aftercare Discharge Planning Group Note  01/04/2016  8:45 AM   Participation Quality: Alert, Appropriate and Oriented  Mood/Affect: Appropriate  Depression Rating: 0  Anxiety Rating: reports mild anxiety related to returning home  Thoughts of Suicide: Pt denies SI/HI  Will you contract for safety? Yes  Current AVH: Pt denies  Plan for Discharge/Comments: Pt attended discharge planning group and actively participated in group. CSW provided pt with today's workbook. Patient reports feeling good but is concerned about affording her medications. She will return home to follow up with outpatient providers.  Transportation Means: Pt reports access to transportation  Supports: No supports mentioned at this time  Samuella Bruin, MSW, [employer] Clinical Social Worker [employer] [phone]

## 2016-01-04 NOTE — [facility] Group Notes (Signed)
[facility] LCSW Group Therapy 01/04/2016  1:15 PM Type of Therapy: Group Therapy Participation Level: Active  Participation Quality: Attentive, Sharing and Supportive  Affect: Appropriate  Cognitive: Alert and Oriented  Insight: Developing/Improving and Engaged  Engagement in Therapy: Developing/Improving and Engaged  Modes of Intervention: Clarification, Confrontation, Discussion, Education, Exploration, Limit-setting, Orientation, Problem-solving, Rapport Building, Reality Testing, Socialization and Support  Summary of Progress/Problems: The topic for group today was emotional regulation. This group focused on both positive and negative emotion identification and allowed group members to process ways to identify feelings, regulate negative emotions, and find healthy ways to manage internal/external emotions. Group members were asked to reflect on a time when their reaction to an emotion led to a negative outcome and explored how alternative responses using emotion regulation would have benefited them. Group members were also asked to discuss a time when emotion regulation was utilized when a negative emotion was experienced.  Keamber Macfadden, MSW, LCSW Clinical Social Worker [facility] [phone]    

## 2016-01-04 NOTE — Progress Notes (Signed)
[facility] MD Progress Note  01/04/2016 6:39 PM Briana Davis  MRN:  [id number] Subjective:  Briana Davis states she is really trying to get her life together. States she does not want to relapse. Upset because another patient who was being D/C said she was going to drink one more time once she got out. Concerned if once she gets out she is going to think on the same terms. states the thought has crossed her mind for her own self  Principal Problem: Bipolar 2 disorder (HCC) Diagnosis:   Patient Active Problem List   Diagnosis Date Noted  . 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 Attention deficit hyperactivity disorder (ADHD) [F90.9] 01/01/2016  . Bipolar 2 disorder (HCC) [F31.81] 01/01/2016  . Polysubstance dependence (HCC) [F19.20] 12/31/2015  . Suicidal ideations [R45.851]   . Depression, major, recurrent, moderate (HCC) [F33.1] 12/15/2015  . Social anxiety disorder [F40.10] 12/15/2015  . 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 Suicidal ideation [R45.851] 12/15/2015   Total Time spent with patient: 20 minutes  Past Psychiatric History: see admission H and P Past Medical History:  Past Medical History  Diagnosis Date  . Kidney stone   . Depression   . Suicidal ideation   . Aggression   . Alcohol consumption binge drinking     Past Surgical History  Procedure Laterality Date  . Tonsillectomy    . Wisdom tooth extraction     Family History: History reviewed. No pertinent family history. Family Psychiatric  History: see admission H and P Social History:  History  Alcohol Use  . 3.0 oz/week  . 2 Cans of beer, 3 Shots of liquor per week    Comment: occasionally     History  Drug Use No    Social History   Social History  . Marital Status: Single    Spouse Name: N/A  . Number of Children: N/A  . Years of Education: N/A   Social History Main Topics  . Smoking status: Current Every Day Smoker -- 1.00 packs/day    Types: Cigarettes  .  Smokeless tobacco: None  . Alcohol Use: 3.0 oz/week    2 Cans of beer, 3 Shots of liquor per week     Comment: occasionally  . Drug Use: No  . Sexual Activity: Not Currently   Other Topics Concern  . None   Social History Narrative   Additional Social History:                         Sleep: Fair  Appetite:  Fair  Current Medications: Current Facility-Administered Medications  Medication Dose Route Frequency Provider Last Rate Last Dose  . acetaminophen (TYLENOL) tablet 650 mg  650 mg Oral Q6H PRN Charm Rings, NP   650 mg at 01/04/16 0754  . alum & mag hydroxide-simeth (MAALOX/MYLANTA) 200-200-20 MG/5ML suspension 30 mL  30 mL Oral Q4H PRN Charm Rings, NP      . amphetamine-dextroamphetamine (ADDERALL XR) 24 hr capsule 10 mg  10 mg Oral BID Rachael Fee, MD   10 mg at 01/04/16 1302  . benzocaine (ORAJEL) 10 % mucosal gel   Mouth/Throat TID PRN Sanjuana Kava, NP      . FLUoxetine (PROZAC) capsule 40 mg  40 mg Oral Daily Charm Rings, NP   40 mg at 01/04/16 0753  . hydrOXYzine (ATARAX/VISTARIL) tablet 25 mg  25 mg Oral Q6H PRN Rachael Fee, MD  25 mg at 01/03/16 1949  . ibuprofen (ADVIL,MOTRIN) tablet 800 mg  800 mg Oral Q6H PRN Rachael Fee, MD   800 mg at 01/04/16 1547  . Influenza vac split quadrivalent PF (FLUARIX) injection 0.5 mL  0.5 mL Intramuscular Tomorrow-1000 Rachael Fee, MD   0.5 mL at 01/01/16 1719  . lamoTRIgine (LAMICTAL) tablet 25 mg  25 mg Oral Daily Rachael Fee, MD   25 mg at 01/04/16 0753  . magnesium hydroxide (MILK OF MAGNESIA) suspension 30 mL  30 mL Oral Daily PRN Charm Rings, NP      . nicotine (NICODERM CQ - dosed in mg/24 hours) patch 21 mg  21 mg Transdermal Daily Rachael Fee, MD   21 mg at 01/04/16 0756  . traZODone (DESYREL) tablet 100 mg  100 mg Oral QHS PRN Rachael Fee, MD   100 mg at 01/03/16 2151    Lab Results: No results found for this or any previous visit (from the past 48 hour(s)).  Physical Findings: AIMS:  Facial and Oral Movements Muscles of Facial Expression: None, normal Lips and Perioral Area: None, normal Jaw: None, normal Tongue: None, normal,Extremity Movements Upper (arms, wrists, hands, fingers): None, normal Lower (legs, knees, ankles, toes): None, normal, Trunk Movements Neck, shoulders, hips: None, normal, Overall Severity Severity of abnormal movements (highest score from questions above): None, normal Incapacitation due to abnormal movements: None, normal Patient's awareness of abnormal movements (rate only patient's report): No Awareness, Dental Status Current problems with teeth and/or dentures?: No Does patient usually wear dentures?: No  CIWA:  CIWA-Ar Total: 2 COWS:     Musculoskeletal: Strength & Muscle Tone: within normal limits Gait & Station: normal Patient leans: normal  Psychiatric Specialty Exam: Review of Systems  Constitutional: Negative.   HENT: Negative.   Eyes: Negative.   Respiratory: Negative.   Cardiovascular: Negative.   Gastrointestinal: Negative.   Genitourinary: Negative.   Musculoskeletal: Negative.   Skin: Negative.   Neurological: Negative.   Endo/Heme/Allergies: Negative.   Psychiatric/Behavioral: Positive for substance abuse. The patient is nervous/anxious.     Blood pressure 116/73, pulse 124, temperature 98.2 F (36.8 C), temperature source Oral, resp. rate 16, height  (1.651 m), weight 64.864 kg (143 lb), last menstrual period 12/08/2015.Body mass index is 23.8 kg/(m^2).  General Appearance: Fairly Groomed  Patent attorney::  Fair  Speech:  Clear and Coherent  Volume:  Normal  Mood:  Anxious and worried  Affect:  anxious worried tearful  Thought Process:  Coherent and Goal Directed  Orientation:  Full (Time, Place, and Person)  Thought Content:  symptoms events worries concerns  Suicidal Thoughts:  No  Homicidal Thoughts:  No  Memory:  Immediate;   Fair Recent;   Fair Remote;   Fair  Judgement:  Fair  Insight:  Present   Psychomotor Activity:  Restlessness  Concentration:  Fair  Recall:  [employer] of Knowledge:Fair  Language: Fair  Akathisia:  No  Handed:  Right  AIMS (if indicated):     Assets:  Desire for Improvement Housing Social Support  ADL's:  Intact  Cognition: WNL  Sleep:  Number of Hours: 6.75   Treatment Plan Summary: Daily contact with patient to assess and evaluate symptoms and progress in treatment and Medication management  Supportive approach/coping skills Polysubstance dependence; continue to work a relapse prevention plan Depression; continue the Prozac 40 mg daily Mood instability; continue to work with the Lamictal Inattentiveness/distracitbility; continue to work with the Adderall  XR 10 mg in AM Insomnia; continue to work with the Trazodone Use CBT/mindfulness/help to get desensitize to other possible events that could trigger her use  Kimiah Hibner A, MD 01/04/2016, 6:39 PM

## 2016-01-04 NOTE — Progress Notes (Signed)
Pt attended NA meeting this evening.  

## 2016-01-04 NOTE — Progress Notes (Signed)
D:Per patient self inventory form pt reports she slept good last night with the use of sleep medication. Pt reports a good appetite, normal energy level, good concentration. Pt rates depression 0/10, hopelessness 0/10, anxiety 2 1/2- all on 0-10 scale, 10 being the worse. Pt denies s/s of withdrawal. Pt denies SI/HI. Denies AVH. Pt reports her goal is "staying positive and learn all I can." Pt reports she will meet her goal by "go to groups and see good in everyone and everything" Pt behavior cooperative, pleasant on approach. Restless at times. Active in the milieu.  A:Special checks q 15 mins in place for safety. Medication administered per MD order (See eMAR) Encouragement and support provided.  R:safety maintained. Compliant with medication regimen. Will continue to monitor.

## 2016-01-04 NOTE — Progress Notes (Signed)
[facility] Group Notes:  (Nursing/MHT/Case Management/Adjunct)  Date:  01/04/2016  Time:  7:14 PM  Type of Therapy:  Psychoeducational Skills  Participation Level:  Active  Participation Quality:  Appropriate and Attentive  Affect:  Appropriate  Cognitive:  Appropriate  Insight:  Appropriate and Good  Engagement in Group:  Engaged and Improving  Modes of Intervention:  Discussion, Education, Exploration, Limit-setting, Socialization and Support  Summary of Progress/Problems: Pt attended group on personal development. Pt shared what values in the workbook that are important to patient. Pt also used the values oriented worksheet to complete ways to improve values.  Karleen Hampshire Brittini 01/04/2016, 7:14 PM

## 2016-01-05 DIAGNOSIS — F902 Attention-deficit hyperactivity disorder, combined type: Secondary | ICD-10-CM | POA: Insufficient documentation

## 2016-01-05 MED ORDER — FLUOXETINE HCL 40 MG PO CAPS: 40.0000 mg | ORAL_CAPSULE | Freq: Every day | ORAL | Status: DC

## 2016-01-05 MED ORDER — TRAZODONE HCL 100 MG PO TABS: 100.0000 mg | ORAL_TABLET | Freq: Every evening | ORAL | Status: DC | PRN

## 2016-01-05 MED ORDER — AMPHETAMINE-DEXTROAMPHET ER 10 MG PO CP24: 10.0000 mg | ORAL_CAPSULE | Freq: Two times a day (BID) | ORAL | Status: DC

## 2016-01-05 MED ORDER — HYDROXYZINE HCL 25 MG PO TABS: 25.0000 mg | ORAL_TABLET | Freq: Four times a day (QID) | ORAL | Status: DC | PRN

## 2016-01-05 MED ORDER — LAMOTRIGINE 25 MG PO TABS: 25.0000 mg | ORAL_TABLET | Freq: Every day | ORAL | Status: DC

## 2016-01-05 MED ORDER — NICOTINE 21 MG/24HR TD PT24: 21.0000 mg | MEDICATED_PATCH | Freq: Every day | TRANSDERMAL | Status: DC

## 2016-01-05 NOTE — [facility] Suicide Risk Assessment (Signed)
[facility] Discharge Suicide Risk Assessment   Principal Problem: Bipolar 2 disorder [facility]) Discharge Diagnoses:  Patient Active Problem List   Diagnosis Date Noted  . 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 Attention deficit hyperactivity disorder (ADHD) [F90.9] 01/01/2016  . Bipolar 2 disorder (HCC) [F31.81] 01/01/2016  . Polysubstance dependence (HCC) [F19.20] 12/31/2015  . Suicidal ideations [R45.851]   . Depression, major, recurrent, moderate (HCC) [F33.1] 12/15/2015  . Social anxiety disorder [F40.10] 12/15/2015  . 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 Suicidal ideation [R45.851] 12/15/2015    Total Time spent with patient: 20 minutes  Musculoskeletal: Strength & Muscle Tone: within normal limits Gait & Station: normal Patient leans: normal  Psychiatric Specialty Exam: Review of Systems  Constitutional: Negative.   HENT: Negative.   Eyes: Negative.   Respiratory: Negative.   Cardiovascular: Negative.   Gastrointestinal: Negative.   Genitourinary: Negative.   Musculoskeletal: Negative.   Skin: Negative.   Neurological: Negative.   Endo/Heme/Allergies: Negative.   Psychiatric/Behavioral: Positive for substance abuse. The patient is nervous/anxious.     Blood pressure 116/73, pulse 124, temperature 98.4 F (36.9 C), temperature source Oral, resp. rate 16, height  (1.651 m), weight 64.864 kg (143 lb), last menstrual period 12/08/2015.Body mass index is 23.8 kg/(m^2).  General Appearance: Fairly Groomed  Patent attorney::  Fair  Speech:  Clear and Coherent409  Volume:  Normal  Mood:  Euthymic  Affect:  Appropriate  Thought Process:  Coherent and Goal Directed  Orientation:  Full (Time, Place, and Person)  Thought Content:  plans as she moves on, relapse prevention plan  Suicidal Thoughts:  No  Homicidal Thoughts:  No  Memory:  Immediate;   Fair Recent;   Fair Remote;   Fair  Judgement:  Fair  Insight:  Present   Psychomotor Activity:  Normal  Concentration:  Fair  Recall:  [employer] of Knowledge:Fair  Language: Fair  Akathisia:  No  Handed:  Right  AIMS (if indicated):     Assets:  Desire for Improvement Housing Social Support Vocational/Educational  Sleep:  Number of Hours: 6.25  Cognition: WNL  ADL's:  Intact  In full contact with reality. There are no active S/S of withdrawal. There are no active SI plans or intent. She is willing and motivated to pursue outpatient treatment. She is going to court tomorrow to face assault charges against her mother while intoxicated. States she is committed to get her life back together. She got a new job, she is going back to church, she is going to go to AA/NA and she has not R/O the possibility of going back to school Mental Status Per Nursing Assessment::   On Admission:   (per chart, pt expressed SI, pt denies)  Demographic Factors:  Adolescent or young adult and Caucasian  Loss Factors: Legal issues  Historical Factors: none identified  Risk Reduction Factors:   Sense of responsibility to family, Employed, Living with another person, especially a relative and Positive social support  Continued Clinical Symptoms:  Alcohol/Substance Abuse/Dependencies  Cognitive Features That Contribute To Risk:  None    Suicide Risk:  Minimal: No identifiable suicidal ideation.  Patients presenting with no risk factors but with morbid ruminations; may be classified as minimal risk based on the severity of the depressive symptoms  Follow-up Information    Follow up with [facility].   Why:  Walk-in clinic for assessment for therapy and medication management services Monday-Friday between 8am to  4pm. Please arrive early and let them know if you are an established client in order to be seen as quickly as possible.   Contact information:   [address] [place] [address] [phone]       Plan Of Care/Follow-up  recommendations:  Activity:  as tolerated Diet:  regular Follow up [facility] as above Chaos Carlile A, MD 01/05/2016, 11:59 AM

## 2016-01-05 NOTE — Progress Notes (Signed)
Discharge Note:  Patient discharged with sister to her home.  Patient denied SI and HI.  Denied A/V hallucinations.  Suicide prevention information given and discussed with patient who stated she understood and had no questions. Patient stated she received all her belongings, clothing, toiletries, misc items, prescriptions, medications.  Patient stated she appreciated all assistance received from [facility] staff.  All discharge information was given to patient as required by [facility].

## 2016-01-05 NOTE — Progress Notes (Signed)
D:  Patient's self inventory sheet, patient sleeps good.  Sleep medication is helpful.  Good appetite, normal energy level, good concentration.  Denied depression and hopeless.  Rated anxiety 6.5.  Denied withdrawals.  Denied SI.  Physical problems, headaches.  Goal is "still staying positive, and take in all the information I can before leaving and going home today."  Plans to attend groups.  "Thank yall for everything.  It's been a positive experience and yall made it wonderful.  Dr. Dub Mikes is GREAT!" A:  Medications administered per MD orders.  Emotional support and encouragement given patient. R:  Denied SI and HI, contracts for safety.  Denied A/V hallucinations.  Safety maintained with 15 minute checks.

## 2016-01-05 NOTE — Discharge Summary (Signed)
Physician Discharge Summary Note  Patient:  Briana Davis is an 25 y.o., female MRN:  [id number] DOB:  05/31/1991 Patient phone:  [phone] (home)  Patient address:   [address] [place] [address],  Total Time spent with patient: Greater than 30 minutes  Date of Admission:  12/31/2015  Date of Discharge: 01-05-16  Reason for Admission:  Mood instability   Principal Problem: Bipolar 2 disorder [facility])  Discharge Diagnoses: Patient Active Problem List   Diagnosis Date Noted  . 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 Attention deficit hyperactivity disorder (ADHD) [F90.9] 01/01/2016  . Bipolar 2 disorder (HCC) [F31.81] 01/01/2016  . Polysubstance dependence (HCC) [F19.20] 12/31/2015  . Suicidal ideations [R45.851]   . Depression, major, recurrent, moderate (HCC) [F33.1] 12/15/2015  . Social anxiety disorder [F40.10] 12/15/2015  . 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 Suicidal ideation [R45.851] 12/15/2015   Past Psychiatric History: Bipolar affective disorder, ADHD, Alcoholism, chronic  Past Medical History:  Past Medical History  Diagnosis Date  . Kidney stone   . Depression   . Suicidal ideation   . Aggression   . Alcohol consumption binge drinking     Past Surgical History  Procedure Laterality Date  . Tonsillectomy    . Wisdom tooth extraction     Family History: History reviewed. No pertinent family history.  Family Psychiatric  History: See H&P  Social History:  History  Alcohol Use  . 3.0 oz/week  . 2 Cans of beer, 3 Shots of liquor per week    Comment: occasionally     History  Drug Use No    Social History   Social History  . Marital Status: Single    Spouse Name: N/A  . Number of Children: N/A  . Years of Education: N/A   Social History Main Topics  . Smoking status: Current Every Day Smoker -- 1.00 packs/day    Types: Cigarettes  . Smokeless tobacco: None  . Alcohol Use: 3.0  oz/week    2 Cans of beer, 3 Shots of liquor per week     Comment: occasionally  . Drug Use: No  . Sexual Activity: Not Currently   Other Topics Concern  . None   Social History Narrative   Hospital Course: 24 Y/O female who states that she is here for mainly "not following through". States she does good for couple of weeks and then falls back into the same pattern. "Drinking, not having a job". Binge drinks "drinks a lot" for a while and then can stay sober for months. States when she does really good she wants to reward herself for doing good. So she starts drinking and can't stop. She states she was diagnosed early on with ADHD. Has been on Adderall before. She has a history of not finishing what she starts, not staying in jobs getting bored, being impulsive. She described mood instability. She has a history of cutting to cope with the way she is feeling at the time.  Although she reported on admission as having been drinking excessively, including binge drinking, Naraly was admitted to the hospital with her BAL at <5 & UDS positive for Amphetamine, Barbiturate & Benzodiazepine. However, she was not presenting with any substance withdrawal symptoms. She presented with mood instability, reported being impulsive & inability to cope. She has a pending assault charge on her mother. Aniza is here for mood stabilization treatments.  During the course of her hospital stay, Kaila was  medicated with Fluoxetine 40 mg for depression, Hydroxyzine 25 mg for anxiety, Adderall 10 mg for ADHD, Lamictal 25 mg for mood stabilization & Trazodone 100 mg for insomnia. She was enrolled & participated in the group counseling sessions being offered & held on this unit. She learned coping skills that should help her cope better to maintain mood stability/sobriety. Other than complaint of tooth pain of which she was prescribed Orajel & Ibuprofen 600 mg prn, Rocky presented no significant health issues that required  treatment. She tolerated her treatment regimen without any adverse effects or reactions. She was discharged on the above listed medications.  Jaeleen's symptoms responded well to her treatment regimen. This is evidenced by her reports of improved mood, ability to focus better & absence of substance withdrawal symptoms. She is currently being discharged to continue psychiatric treatment on an outpatient basis at the [facility] clinic in [place], [place]. She is provided with all the necessary information needed to make this appointment without problems. Okema indicated during her hospital stay that she is now determined to continue her AA/NA weekly meetings to strengthen & maintain her sobriety . She believed that she can do this. She has a lot of agenda in place to stay busy, enroll back in school to obtain her GED, have a job & go to weekly meetings.   Upon discharge, she adamantly denies any SIHI, AVH, delusional thoughts or paranoia. She was provided with a 7 days worth, supply samples of her [facility] discharge medications by the [facility] pharmacy. She left [facility] with all personal belongings in no apparent distress. Transportation per sister.  Physical Findings: AIMS: Facial and Oral Movements Muscles of Facial Expression: None, normal Lips and Perioral Area: None, normal Jaw: None, normal Tongue: None, normal,Extremity Movements Upper (arms, wrists, hands, fingers): None, normal Lower (legs, knees, ankles, toes): None, normal, Trunk Movements Neck, shoulders, hips: None, normal, Overall Severity Severity of abnormal movements (highest score from questions above): None, normal Incapacitation due to abnormal movements: None, normal Patient's awareness of abnormal movements (rate only patient's report): No Awareness, Dental Status Current problems with teeth and/or dentures?: No Does patient usually wear dentures?: No  CIWA:  CIWA-Ar Total: 4 COWS:     Musculoskeletal: Strength & Muscle Tone: within normal  limits Gait & Station: normal Patient leans: N/A  Psychiatric Specialty Exam: Review of Systems  Constitutional: Negative.   HENT: Negative.   Eyes: Negative.   Respiratory: Negative.   Cardiovascular: Negative.   Gastrointestinal: Negative.   Genitourinary: Negative.   Musculoskeletal: Negative.   Skin: Negative.   Neurological: Negative.   Endo/Heme/Allergies: Negative.   Psychiatric/Behavioral: Positive for depression (Stable) and substance abuse (Hx, alcoholism, chronic). Negative for suicidal ideas, hallucinations and memory loss. The patient has insomnia (Stable). The patient is not nervous/anxious.     Blood pressure 128/77, pulse 128, temperature 98.4 F (36.9 C), temperature source Oral, resp. rate 16, height 5\' 5"  (1.651 m), weight 64.864 kg (143 lb), last menstrual period 12/08/2015.Body mass index is 23.8 kg/(m^2).  See Md's SRA   Have you used any form of tobacco in the last 30 days? (Cigarettes, Smokeless Tobacco, Cigars, and/or Pipes): Yes  Has this patient used any form of tobacco in the last 30 days? (Cigarettes, Smokeless Tobacco, Cigars, and/or Pipes) Yes, Yes, A prescription for an FDA-approved tobacco cessation medication was offered at discharge and the patient refused  Metabolic Disorder Labs:  No results found for: HGBA1C, MPG No results found for: PROLACTIN No results found for: CHOL, TRIG,  HDL, CHOLHDL, VLDL, LDLCALC  See Psychiatric Specialty Exam and Suicide Risk Assessment completed by Attending Physician prior to discharge.  Discharge destination:  Home  Is patient on multiple antipsychotic therapies at discharge:  No   Has Patient had three or more failed trials of antipsychotic monotherapy by history:  No  Recommended Plan for Multiple Antipsychotic Therapies: NA    Medication List    STOP taking these medications        aspirin-acetaminophen-caffeine 250-250-65 MG tablet  Commonly known as:  EXCEDRIN MIGRAINE     SLEEP AID PO       TAKE these medications      Indication   amphetamine-dextroamphetamine 10 MG 24 hr capsule  Commonly known as:  ADDERALL XR  Take 1 capsule (10 mg total) by mouth 2 (two) times daily. For ADHD   Indication:  Attention Deficit Hyperactivity Disorder     FLUoxetine 40 MG capsule  Commonly known as:  PROZAC  Take 1 capsule (40 mg total) by mouth daily. For depression   Indication:  Major Depressive Disorder     hydrOXYzine 25 MG tablet  Commonly known as:  ATARAX/VISTARIL  Take 1 tablet (25 mg total) by mouth every 6 (six) hours as needed for anxiety.   Indication:  Anxiety     lamoTRIgine 25 MG tablet  Commonly known as:  LAMICTAL  Take 1 tablet (25 mg total) by mouth daily. For mood stabilization   Indication:  Mood stabilization     nicotine 21 mg/24hr patch  Commonly known as:  NICODERM CQ - dosed in mg/24 hours  Place 1 patch (21 mg total) onto the skin daily. For smoking cessation   Indication:  Nicotine Addiction     traZODone 100 MG tablet  Commonly known as:  DESYREL  Take 1 tablet (100 mg total) by mouth at bedtime as needed for sleep (may repeat x 1 in 1 hour if needed).   Indication:  Trouble Sleeping       Follow-up Information    Follow up with [facility].   Why:  Walk-in clinic for assessment for therapy and medication management services Monday-Friday between 8am to 4pm. Please arrive early and let them know if you are an established client in order to be seen as quickly as possible.   Contact information:   [address] [place] [address] [phone]      Follow-up recommendations: Activity:  As tolerated Diet: As recommended by your primary care doctor. Keep all scheduled follow-up appointments as recommended.   Comments: Take all your medications as prescribed by your mental healthcare provider. Report any adverse effects and or reactions from your medicines to your outpatient provider promptly. Patient is instructed and  cautioned to not engage in alcohol and or illegal drug use while on prescription medicines. In the event of worsening symptoms, patient is instructed to call the crisis hotline, 911 and or go to the nearest ED for appropriate evaluation and treatment of symptoms. Follow-up with your primary care provider for your other medical issues, concerns and or health care needs.   Signed: Sanjuana Kava, NP, PMHNP-BC 01/05/2016, 10:53 AM  I personally assessed the patient and formulated the plan Madie Reno A. Dub Mikes, M.D.

## 2016-01-05 NOTE — [facility] Group Notes (Signed)
[facility] LCSW Group Therapy 01/05/2016 1:15 PM Type of Therapy: Group Therapy Participation Level: Active  Participation Quality: Attentive, Sharing and Supportive  Affect: Appropriate  Cognitive: Alert and Oriented  Insight: Developing/Improving and Engaged  Engagement in Therapy: Developing/Improving and Engaged  Modes of Intervention: Activity, Clarification, Confrontation, Discussion, Education, Exploration, Limit-setting, Orientation, Problem-solving, Rapport Building, Reality Testing, Socialization and Support  Summary of Progress/Problems: Patient was attentive and engaged with speaker from [employer]. Patient was attentive to speaker while they shared their story of dealing with mental health and overcoming it. Patient expressed interest in their programs and services and received information on their agency. Patient processed ways they can relate to the speaker.   Marnee Sherrard, LCSW Clinical Social Worker [facility] [phone]   

## 2016-01-05 NOTE — [facility] Group Notes (Signed)

## 2016-01-05 NOTE — Progress Notes (Signed)
  [facility] Adult Case Management Discharge Plan :  Will you be returning to the same living situation after discharge:  Yes,  patient plans to return home At discharge, do you have transportation home?: Yes,  patient's sister will pick up Do you have the ability to pay for your medications: Yes,  patient will be provided with prescriptions at discharge  Release of information consent forms completed and in the chart;  Patient's signature needed at discharge.  Patient to Follow up at: Follow-up Information    Follow up with [facility].   Why:  Walk-in clinic for assessment for therapy and medication management services Monday-Friday between 8am to 4pm. Please arrive early and let them know if you are an established client in order to be seen as quickly as possible.   Contact information:   [address] [place] [address] [phone]       Next level of care provider has access to [facility] Link:no  Safety Planning and Suicide Prevention discussed: Yes,  with patient  Have you used any form of tobacco in the last 30 days? (Cigarettes, Smokeless Tobacco, Cigars, and/or Pipes): Yes  Has patient been referred to the Quitline?: Patient refused referral  Patient has been referred for addiction treatment: Yes  Briana Davis, [place] 01/05/2016, 8:44 AM

## 2016-01-12 ENCOUNTER — Emergency Department (HOSPITAL_COMMUNITY): Payer: Self-pay

## 2016-01-12 ENCOUNTER — Encounter (HOSPITAL_COMMUNITY): Payer: Self-pay | Admitting: Emergency Medicine

## 2016-01-12 ENCOUNTER — Emergency Department (HOSPITAL_COMMUNITY)
Admission: EM | Admit: 2016-01-12 | Discharge: 2016-01-13 | Disposition: A | Discharge status: Home / Self Care | Payer: Self-pay | Attending: Emergency Medicine | Admitting: Emergency Medicine

## 2016-01-12 DIAGNOSIS — Z87442 Personal history of urinary calculi: Secondary | ICD-10-CM | POA: Insufficient documentation

## 2016-01-12 DIAGNOSIS — K5732 Diverticulitis of large intestine without perforation or abscess without bleeding: Secondary | ICD-10-CM | POA: Insufficient documentation

## 2016-01-12 DIAGNOSIS — Z91040 Latex allergy status: Secondary | ICD-10-CM | POA: Insufficient documentation

## 2016-01-12 DIAGNOSIS — F329 Major depressive disorder, single episode, unspecified: Secondary | ICD-10-CM | POA: Insufficient documentation

## 2016-01-12 DIAGNOSIS — Z880 Allergy status to penicillin: Secondary | ICD-10-CM | POA: Insufficient documentation

## 2016-01-12 DIAGNOSIS — Z3202 Encounter for pregnancy test, result negative: Secondary | ICD-10-CM | POA: Insufficient documentation

## 2016-01-12 DIAGNOSIS — R52 Pain, unspecified: Secondary | ICD-10-CM

## 2016-01-12 DIAGNOSIS — Z79899 Other long term (current) drug therapy: Secondary | ICD-10-CM | POA: Insufficient documentation

## 2016-01-12 DIAGNOSIS — F17210 Nicotine dependence, cigarettes, uncomplicated: Secondary | ICD-10-CM | POA: Insufficient documentation

## 2016-01-12 LAB — COMPREHENSIVE METABOLIC PANEL
ALBUMIN: 4.2 g/dL (ref 3.5–5.0)
ALK PHOS: 85 U/L (ref 38–126)
ALT: 13 U/L — ABNORMAL LOW (ref 14–54)
ANION GAP: 9 (ref 5–15)
AST: 15 U/L (ref 15–41)
BILIRUBIN TOTAL: 0.2 mg/dL — AB (ref 0.3–1.2)
BUN: 11 mg/dL (ref 6–20)
CALCIUM: 9.2 mg/dL (ref 8.9–10.3)
CO2: 25 mmol/L (ref 22–32)
Chloride: 105 mmol/L (ref 101–111)
Creatinine, Ser: 0.43 mg/dL — ABNORMAL LOW (ref 0.44–1.00)
GLUCOSE: 106 mg/dL — AB (ref 65–99)
Potassium: 3.7 mmol/L (ref 3.5–5.1)
Sodium: 139 mmol/L (ref 135–145)
TOTAL PROTEIN: 7.3 g/dL (ref 6.5–8.1)

## 2016-01-12 LAB — URINALYSIS, ROUTINE W REFLEX MICROSCOPIC
Bilirubin Urine: NEGATIVE
Glucose, UA: NEGATIVE mg/dL
Ketones, ur: NEGATIVE mg/dL
LEUKOCYTES UA: NEGATIVE
NITRITE: NEGATIVE
PROTEIN: NEGATIVE mg/dL
SPECIFIC GRAVITY, URINE: 1.015 (ref 1.005–1.030)
pH: 6.5 (ref 5.0–8.0)

## 2016-01-12 LAB — CBC WITH DIFFERENTIAL/PLATELET
Basophils Absolute: 0 10*3/uL (ref 0.0–0.1)
Basophils Relative: 0 %
Eosinophils Absolute: 0.1 10*3/uL (ref 0.0–0.7)
Eosinophils Relative: 1 %
HEMATOCRIT: 36.8 % (ref 36.0–46.0)
HEMOGLOBIN: 12.4 g/dL (ref 12.0–15.0)
LYMPHS PCT: 23 %
Lymphs Abs: 3.6 10*3/uL (ref 0.7–4.0)
MCH: 32.2 pg (ref 26.0–34.0)
MCHC: 33.7 g/dL (ref 30.0–36.0)
MCV: 95.6 fL (ref 78.0–100.0)
MONO ABS: 1.4 10*3/uL — AB (ref 0.1–1.0)
MONOS PCT: 9 %
NEUTROS ABS: 10.1 10*3/uL — AB (ref 1.7–7.7)
Neutrophils Relative %: 67 %
Platelets: 457 10*3/uL — ABNORMAL HIGH (ref 150–400)
RBC: 3.85 MIL/uL — ABNORMAL LOW (ref 3.87–5.11)
RDW: 12.6 % (ref 11.5–15.5)
WBC: 15.2 10*3/uL — AB (ref 4.0–10.5)

## 2016-01-12 LAB — PREGNANCY, URINE: PREG TEST UR: NEGATIVE

## 2016-01-12 LAB — URINE MICROSCOPIC-ADD ON

## 2016-01-12 LAB — I-STAT BETA HCG BLOOD, ED (MC, WL, AP ONLY)

## 2016-01-12 MED ORDER — LORAZEPAM 2 MG/ML IJ SOLN
0.5000 mg | Freq: Once | INTRAMUSCULAR | Status: AC
  Administered 2016-01-12: 0.5 mg via INTRAVENOUS
  Filled 2016-01-12: qty 1

## 2016-01-12 MED ORDER — METRONIDAZOLE 500 MG PO TABS: 500.0000 mg | ORAL_TABLET | Freq: Four times a day (QID) | ORAL | Status: DC

## 2016-01-12 MED ORDER — CIPROFLOXACIN HCL 500 MG PO TABS: ORAL_TABLET | ORAL | Status: DC

## 2016-01-12 MED ORDER — CIPROFLOXACIN IN D5W 400 MG/200ML IV SOLN
400.0000 mg | Freq: Once | INTRAVENOUS | Status: AC
  Administered 2016-01-12: 400 mg via INTRAVENOUS
  Filled 2016-01-12: qty 200

## 2016-01-12 MED ORDER — ONDANSETRON 4 MG PO TBDP: ORAL_TABLET | ORAL | Status: DC

## 2016-01-12 MED ORDER — OXYCODONE-ACETAMINOPHEN 5-325 MG PO TABS: 1.0000 | ORAL_TABLET | Freq: Four times a day (QID) | ORAL | Status: DC | PRN

## 2016-01-12 MED ORDER — ONDANSETRON HCL 4 MG/2ML IJ SOLN
4.0000 mg | Freq: Once | INTRAMUSCULAR | Status: AC
  Administered 2016-01-12: 4 mg via INTRAVENOUS
  Filled 2016-01-12: qty 2

## 2016-01-12 MED ORDER — KETOROLAC TROMETHAMINE 30 MG/ML IJ SOLN
30.0000 mg | Freq: Once | INTRAMUSCULAR | Status: AC
  Administered 2016-01-12: 30 mg via INTRAVENOUS
  Filled 2016-01-12: qty 1

## 2016-01-12 MED ORDER — METRONIDAZOLE IN NACL 5-0.79 MG/ML-% IV SOLN
500.0000 mg | Freq: Once | INTRAVENOUS | Status: AC
  Administered 2016-01-12: 500 mg via INTRAVENOUS
  Filled 2016-01-12: qty 100

## 2016-01-12 MED ORDER — HYDROMORPHONE HCL 1 MG/ML IJ SOLN
0.5000 mg | Freq: Once | INTRAMUSCULAR | Status: AC
  Administered 2016-01-12: 0.5 mg via INTRAVENOUS
  Filled 2016-01-12: qty 1

## 2016-01-12 MED ORDER — HYDROMORPHONE HCL 1 MG/ML IJ SOLN
1.0000 mg | Freq: Once | INTRAMUSCULAR | Status: AC
  Administered 2016-01-12: 1 mg via INTRAVENOUS
  Filled 2016-01-12: qty 1

## 2016-01-12 NOTE — Discharge Instructions (Signed)
Follow up with Dr. Kelvin Cellar or another md in [place] next week

## 2016-01-12 NOTE — ED Provider Notes (Signed)
CSN: [id number]     Arrival date & time 01/12/16  1738 History   First MD Initiated Contact with Patient 01/12/16 1931     Chief Complaint  Patient presents with  . Flank Pain     (Consider location/radiation/quality/duration/timing/severity/associated sxs/prior Treatment) Patient is a 25 y.o. female presenting with flank pain. The history is provided by the patient (Patient complains of right flank pain and some abdominal discomfort).  Flank Pain This is a new problem. The current episode started 6 to 12 hours ago. The problem occurs constantly. Associated symptoms include abdominal pain. Pertinent negatives include no chest pain and no headaches. Nothing aggravates the symptoms. Nothing relieves the symptoms.    Past Medical History  Diagnosis Date  . Kidney stone   . Depression   . Suicidal ideation   . Aggression   . Alcohol consumption binge drinking    Past Surgical History  Procedure Laterality Date  . Tonsillectomy    . Wisdom tooth extraction     History reviewed. No pertinent family history. Social History  Substance Use Topics  . Smoking status: Current Every Day Smoker -- 1.00 packs/day    Types: Cigarettes  . Smokeless tobacco: None  . Alcohol Use: None     Comment: Sober for 20 days   OB History    Gravida Para Term Preterm AB TAB SAB Ectopic Multiple Living       Review of Systems  Constitutional: Negative for appetite change and fatigue.  HENT: Negative for congestion, ear discharge and sinus pressure.   Eyes: Negative for discharge.  Respiratory: Negative for cough.   Cardiovascular: Negative for chest pain.  Gastrointestinal: Positive for abdominal pain. Negative for diarrhea.  Genitourinary: Positive for flank pain. Negative for frequency and hematuria.  Musculoskeletal: Negative for back pain.  Skin: Negative for rash.  Neurological: Negative for seizures and headaches.  Psychiatric/Behavioral: Negative for hallucinations.       Allergies  Latex and Amoxicillin  Home Medications   Prior to Admission medications   Medication Sig Start Date End Date Taking? Authorizing Provider  amphetamine-dextroamphetamine (ADDERALL XR) 10 MG 24 hr capsule Take 1 capsule (10 mg total) by mouth 2 (two) times daily. For ADHD 01/05/16  Yes Sanjuana Kava, NP  FLUoxetine (PROZAC) 40 MG capsule Take 1 capsule (40 mg total) by mouth daily. For depression 01/05/16  Yes Sanjuana Kava, NP  hydrOXYzine (ATARAX/VISTARIL) 25 MG tablet Take 1 tablet (25 mg total) by mouth every 6 (six) hours as needed for anxiety. 01/05/16  Yes Sanjuana Kava, NP  lamoTRIgine (LAMICTAL) 25 MG tablet Take 1 tablet (25 mg total) by mouth daily. For mood stabilization 01/05/16  Yes Sanjuana Kava, NP  traZODone (DESYREL) 100 MG tablet Take 1 tablet (100 mg total) by mouth at bedtime as needed for sleep (may repeat x 1 in 1 hour if needed). 01/05/16  Yes Sanjuana Kava, NP  ciprofloxacin (CIPRO) 500 MG tablet One po bid 01/12/16   Bethann Berkshire, MD  metroNIDAZOLE (FLAGYL) 500 MG tablet Take 1 tablet (500 mg total) by mouth 4 (four) times daily. 01/12/16   Bethann Berkshire, MD  nicotine (NICODERM CQ - DOSED IN MG/24 HOURS) 21 mg/24hr patch Place 1 patch (21 mg total) onto the skin daily. For smoking cessation Patient not taking: Reported on 01/12/2016 01/05/16   Sanjuana Kava, NP  ondansetron (ZOFRAN ODT) 4 MG disintegrating tablet  ODT q4 hours prn  nausea/vomit 01/12/16   Bethann Berkshire, MD  oxyCODONE-acetaminophen (PERCOCET/ROXICET) 5-325 MG tablet Take 1 tablet by mouth every 6 (six) hours as needed. 01/12/16   Bethann Berkshire, MD  oxyCODONE-acetaminophen (PERCOCET/ROXICET) 5-325 MG tablet Take 1 tablet by mouth every 6 (six) hours as needed. 01/12/16   Bethann Berkshire, MD   BP 126/86 mmHg  Pulse 90  Temp(Src) 98.4 F (36.9 C) (Oral)  Resp 16  Ht  (1.651 m)  Wt 145 lb (65.772 kg)  BMI 24.13 kg/m2  SpO2 100%  LMP 12/02/2015 Physical Exam  Constitutional: She is oriented to  person, place, and time. She appears well-developed.  HENT:  Head: Normocephalic.  Eyes: Conjunctivae and EOM are normal. No scleral icterus.  Neck: Neck supple. No thyromegaly present.  Cardiovascular: Normal rate and regular rhythm.  Exam reveals no gallop and no friction rub.   No murmur heard. Pulmonary/Chest: No stridor. She has no wheezes. She has no rales. She exhibits no tenderness.  Abdominal: She exhibits no distension. There is tenderness. There is no rebound.  Tenderness left lower quadrant left upper quadrant  Genitourinary:  Moderate right flank tenderness  Musculoskeletal: Normal range of motion. She exhibits no edema.  Lymphadenopathy:    She has no cervical adenopathy.  Neurological: She is oriented to person, place, and time. She exhibits normal muscle tone. Coordination normal.  Skin: No rash noted. No erythema.  Psychiatric: She has a normal mood and affect. Her behavior is normal.    ED Course  Procedures (including critical care time) Labs Review Labs Reviewed  URINALYSIS, ROUTINE W REFLEX MICROSCOPIC (NOT AT [facility]) - Abnormal; Notable for the following:    Hgb urine dipstick SMALL (*)    All other components within normal limits  CBC WITH DIFFERENTIAL/PLATELET - Abnormal; Notable for the following:    WBC 15.2 (*)    RBC 3.85 (*)    Platelets 457 (*)    Neutro Abs 10.1 (*)    Monocytes Absolute 1.4 (*)    All other components within normal limits  COMPREHENSIVE METABOLIC PANEL - Abnormal; Notable for the following:    Glucose, Bld 106 (*)    Creatinine, Ser 0.43 (*)    ALT 13 (*)    Total Bilirubin 0.2 (*)    All other components within normal limits  URINE MICROSCOPIC-ADD ON - Abnormal; Notable for the following:    Squamous Epithelial / LPF 6-30 (*)    Bacteria, UA FEW (*)    All other components within normal limits  PREGNANCY, URINE  I-STAT BETA HCG BLOOD, ED ([facility], [facility], [facility] ONLY)    Imaging Review Ct Renal Stone Study  01/12/2016  CLINICAL DATA:   25 year old female with right flank pain. Burning on urination. EXAM: CT ABDOMEN AND PELVIS WITHOUT CONTRAST TECHNIQUE: Multidetector CT imaging of the abdomen and pelvis was performed following the standard protocol without IV contrast. COMPARISON:  CT dated 09/26/2012 FINDINGS: Evaluation of this exam is limited in the absence of intravenous contrast. The visualized lung bases are clear. No intra-abdominal free air. Small free fluid within the pelvis. The liver, gallbladder, pancreas, spleen, and the adrenal glands appear unremarkable. Multiple small nonobstructing bilateral renal calculi noted measuring up to 3 mm in the inferior pole of the left kidney. There is no hydronephrosis on either side. The visualized ureters and urinary bladder appear unremarkable. The uterus is anteverted and appears grossly unremarkable. The visualized ovaries are also unremarkable. There is moderate stool throughout the colon. There is an inflamed diverticulum  in the posterior wall of the cecum just above the ileocecal valve. No evidence of perforation or abscess. There is no evidence of bowel obstruction. Normal appendix. The abdominal aorta and IVC appear grossly unremarkable on this noncontrast study. No portal venous gas identified. There is no adenopathy. The chest wall soft tissues appear unremarkable. The osseous structures are intact. IMPRESSION: Diverticulitis of the cecum.  No abscess. Small nonobstructing bilateral renal calculi.  No hydronephrosis. Electronically Signed   By: Elgie Collard M.D.   On: 01/12/2016 21:53   I have personally reviewed and evaluated these images and lab results as part of my medical decision-making.   EKG Interpretation None      MDM   Final diagnoses:  Pain  Diverticulitis of large intestine without perforation or abscess without bleeding    Patient with diverticulitis she will be treated with Flagyl Cipro Percocet Zofran and follow-up next week   Bethann Berkshire,  MD 01/12/16 2316

## 2016-01-12 NOTE — ED Notes (Signed)
PT c/o right flank pain with dark colored urine and burning with urination starting today and states hx of kidney stones.

## 2016-01-19 MED FILL — Oxycodone w/ Acetaminophen Tab 5-325 MG: ORAL | Qty: 6 | Status: AC

## 2016-02-12 ENCOUNTER — Emergency Department
Admission: EM | Admit: 2016-02-12 | Discharge: 2016-02-12 | Disposition: A | Discharge status: Home / Self Care | Payer: Self-pay | Attending: Emergency Medicine | Admitting: Emergency Medicine

## 2016-02-12 ENCOUNTER — Encounter: Payer: Self-pay | Admitting: Emergency Medicine

## 2016-02-12 ENCOUNTER — Emergency Department: Payer: Self-pay

## 2016-02-12 DIAGNOSIS — Z91040 Latex allergy status: Secondary | ICD-10-CM | POA: Insufficient documentation

## 2016-02-12 DIAGNOSIS — Z880 Allergy status to penicillin: Secondary | ICD-10-CM | POA: Insufficient documentation

## 2016-02-12 DIAGNOSIS — Z792 Long term (current) use of antibiotics: Secondary | ICD-10-CM | POA: Insufficient documentation

## 2016-02-12 DIAGNOSIS — N200 Calculus of kidney: Secondary | ICD-10-CM | POA: Insufficient documentation

## 2016-02-12 DIAGNOSIS — N23 Unspecified renal colic: Secondary | ICD-10-CM | POA: Insufficient documentation

## 2016-02-12 DIAGNOSIS — Z79899 Other long term (current) drug therapy: Secondary | ICD-10-CM | POA: Insufficient documentation

## 2016-02-12 DIAGNOSIS — F17210 Nicotine dependence, cigarettes, uncomplicated: Secondary | ICD-10-CM | POA: Insufficient documentation

## 2016-02-12 DIAGNOSIS — Z3202 Encounter for pregnancy test, result negative: Secondary | ICD-10-CM | POA: Insufficient documentation

## 2016-02-12 HISTORY — DX: Diverticulitis of intestine, part unspecified, without perforation or abscess without bleeding: K57.92

## 2016-02-12 LAB — COMPREHENSIVE METABOLIC PANEL
ALK PHOS: 75 U/L (ref 38–126)
ALT: 17 U/L (ref 14–54)
ANION GAP: 11 (ref 5–15)
AST: 22 U/L (ref 15–41)
Albumin: 4.6 g/dL (ref 3.5–5.0)
BUN: 9 mg/dL (ref 6–20)
CALCIUM: 9.3 mg/dL (ref 8.9–10.3)
CO2: 25 mmol/L (ref 22–32)
Chloride: 102 mmol/L (ref 101–111)
Creatinine, Ser: 0.55 mg/dL (ref 0.44–1.00)
GFR calc non Af Amer: >60 mL/min (ref >60)
Glucose, Bld: 91 mg/dL (ref 65–99)
POTASSIUM: 3.3 mmol/L — AB (ref 3.5–5.1)
SODIUM: 138 mmol/L (ref 135–145)
TOTAL PROTEIN: 8.1 g/dL (ref 6.5–8.1)
Total Bilirubin: 0.5 mg/dL (ref 0.3–1.2)

## 2016-02-12 LAB — CBC WITH DIFFERENTIAL/PLATELET
BASOS PCT: 0 %
Basophils Absolute: 0 10*3/uL (ref 0–0.1)
EOS ABS: 0 10*3/uL (ref 0–0.7)
Eosinophils Relative: 0 %
HCT: 39.6 % (ref 35.0–47.0)
HEMOGLOBIN: 13.7 g/dL (ref 12.0–16.0)
Lymphocytes Relative: 25 %
Lymphs Abs: 2.3 10*3/uL (ref 1.0–3.6)
MCH: 32.6 pg (ref 26.0–34.0)
MCHC: 34.6 g/dL (ref 32.0–36.0)
MCV: 94.2 fL (ref 80.0–100.0)
Monocytes Absolute: 1.6 10*3/uL — ABNORMAL HIGH (ref 0.2–0.9)
Monocytes Relative: 18 %
NEUTROS PCT: 57 %
Neutro Abs: 5 10*3/uL (ref 1.4–6.5)
Platelets: 403 10*3/uL (ref 150–440)
RBC: 4.21 MIL/uL (ref 3.80–5.20)
RDW: 12.9 % (ref 11.5–14.5)
WBC: 8.9 10*3/uL (ref 3.6–11.0)

## 2016-02-12 LAB — URINALYSIS COMPLETE WITH MICROSCOPIC (ARMC ONLY)
Bacteria, UA: NONE SEEN
Bilirubin Urine: NEGATIVE
Glucose, UA: NEGATIVE mg/dL
KETONES UR: NEGATIVE mg/dL
LEUKOCYTES UA: NEGATIVE
NITRITE: NEGATIVE
PH: 6 (ref 5.0–8.0)
PROTEIN: NEGATIVE mg/dL
Specific Gravity, Urine: 1.014 (ref 1.005–1.030)

## 2016-02-12 LAB — LIPASE, BLOOD: Lipase: 20 U/L (ref 11–51)

## 2016-02-12 LAB — POCT PREGNANCY, URINE: Preg Test, Ur: NEGATIVE

## 2016-02-12 MED ORDER — ONDANSETRON HCL 4 MG/2ML IJ SOLN
INTRAMUSCULAR | Status: AC
  Administered 2016-02-12: 4 mg via INTRAVENOUS
  Filled 2016-02-12: qty 2

## 2016-02-12 MED ORDER — IOHEXOL 240 MG/ML SOLN: 25.0000 mL | Freq: Once | INTRAMUSCULAR | Status: DC | PRN

## 2016-02-12 MED ORDER — SODIUM CHLORIDE 0.9 % IV BOLUS (SEPSIS)
1000.0000 mL | Freq: Once | INTRAVENOUS | Status: AC
  Administered 2016-02-12: 1000 mL via INTRAVENOUS

## 2016-02-12 MED ORDER — NAPROXEN 500 MG PO TABS: 500.0000 mg | ORAL_TABLET | Freq: Two times a day (BID) | ORAL | Status: DC

## 2016-02-12 MED ORDER — ONDANSETRON HCL 4 MG/2ML IJ SOLN
4.0000 mg | Freq: Once | INTRAMUSCULAR | Status: AC
  Administered 2016-02-12: 4 mg via INTRAVENOUS

## 2016-02-12 MED ORDER — IOHEXOL 240 MG/ML SOLN: 25.0000 mL | INTRAMUSCULAR | Status: AC

## 2016-02-12 MED ORDER — MORPHINE SULFATE (PF) 2 MG/ML IV SOLN
2.0000 mg | INTRAVENOUS | Status: AC
  Administered 2016-02-12: 2 mg via INTRAVENOUS

## 2016-02-12 MED ORDER — GI COCKTAIL ~~LOC~~
30.0000 mL | ORAL | Status: AC
  Administered 2016-02-12: 30 mL via ORAL
  Filled 2016-02-12: qty 30

## 2016-02-12 MED ORDER — ONDANSETRON 4 MG PO TBDP: 4.0000 mg | ORAL_TABLET | Freq: Three times a day (TID) | ORAL | Status: DC | PRN

## 2016-02-12 MED ORDER — MORPHINE SULFATE (PF) 2 MG/ML IV SOLN
INTRAVENOUS | Status: AC
  Administered 2016-02-12: 2 mg via INTRAVENOUS
  Filled 2016-02-12: qty 1

## 2016-02-12 MED ORDER — PANTOPRAZOLE SODIUM 40 MG IV SOLR
40.0000 mg | Freq: Once | INTRAVENOUS | Status: AC
  Administered 2016-02-12: 40 mg via INTRAVENOUS
  Filled 2016-02-12: qty 40

## 2016-02-12 MED ORDER — OXYCODONE-ACETAMINOPHEN 5-325 MG PO TABS: 1.0000 | ORAL_TABLET | Freq: Four times a day (QID) | ORAL | Status: DC | PRN

## 2016-02-12 MED ORDER — IOHEXOL 300 MG/ML  SOLN
100.0000 mL | Freq: Once | INTRAMUSCULAR | Status: AC | PRN
  Administered 2016-02-12: 100 mL via INTRAVENOUS

## 2016-02-12 NOTE — ED Notes (Signed)
Pt seen at [facility] last week and dx with diverticulitis, did not take antibiotics as prescribed. Bilateral lower abdominal pain, not as severe as initial pain but constant. Pt denies urinary sx out of the ordinary, pt reports baseline mild burning with urination. No BM X 3 days. Pt able to tolerate PO fluids and crackers. MD at bedside.

## 2016-02-12 NOTE — ED Notes (Signed)
Pt alert and oriented X4, active, cooperative, pt in NAD. RR even and unlabored, color WNL.  Pt informed to return if any life threatening symptoms occur.  Pt is not altered in anyway from narcotic IV pain medication administered earlier.

## 2016-02-12 NOTE — Discharge Instructions (Signed)
You were prescribed a medication that is potentially sedating. Do not drink alcohol, drive or participate in any other potentially dangerous activities while taking this medication as it may make you sleepy. Do not take this medication with any other sedating medications, either prescription or over-the-counter. If you were prescribed Percocet or Vicodin, do not take these with acetaminophen (Tylenol) as it is already contained within these medications. °  °Opioid pain medications (or "narcotics") can be habit forming.  Use it as little as possible to achieve adequate pain control.  Do not use or use it with extreme caution if you have a history of opiate abuse or dependence.  If you are on a pain contract with your primary care doctor or a pain specialist, be sure to let them know you were prescribed this medication today from the [facility].  This medication is intended for your use only - do not give any to anyone else and keep it in a secure place where nobody else, especially children and pets, have access to it.  It will also cause or worsen constipation, so you may want to consider taking an over-the-counter stool softener while you are taking this medication. ° °Kidney Stones °Kidney stones (urolithiasis) are deposits that form inside your kidneys. The intense pain is caused by the stone moving through the urinary tract. When the stone moves, the ureter goes into spasm around the stone. The stone is usually passed in the urine.  °CAUSES  °· A disorder that makes certain neck glands produce too much parathyroid hormone (primary hyperparathyroidism). °· A buildup of uric acid crystals, similar to gout in your joints. °· Narrowing (stricture) of the ureter. °· A kidney obstruction present at birth (congenital obstruction). °· Previous surgery on the kidney or ureters. °· Numerous kidney infections. °SYMPTOMS  °· Feeling sick to your stomach (nauseous). °· Throwing up  (vomiting). °· Blood in the urine (hematuria). °· Pain that usually spreads (radiates) to the groin. °· Frequency or urgency of urination. °DIAGNOSIS  °· Taking a history and physical exam. °· Blood or urine tests. °· CT scan. °· Occasionally, an examination of the inside of the urinary bladder (cystoscopy) is performed. °TREATMENT  °· Observation. °· Increasing your fluid intake. °· Extracorporeal shock wave lithotripsy--This is a noninvasive procedure that uses shock waves to break up kidney stones. °· Surgery may be needed if you have severe pain or persistent obstruction. There are various surgical procedures. Most of the procedures are performed with the use of small instruments. Only small incisions are needed to accommodate these instruments, so recovery time is minimized. °The size, location, and chemical composition are all important variables that will determine the proper choice of action for you. Talk to your health care provider to better understand your situation so that you will minimize the risk of injury to yourself and your kidney.  °HOME CARE INSTRUCTIONS  °· Drink enough water and fluids to keep your urine clear or pale yellow. This will help you to pass the stone or stone fragments. °· Strain all urine through the provided strainer. Keep all particulate matter and stones for your health care provider to see. The stone causing the pain may be as small as a grain of salt. It is very important to use the strainer each and every time you pass your urine. The collection of your stone will allow your health care provider to analyze it and verify that a stone has actually passed. The stone analysis will often   identify what you can do to reduce the incidence of recurrences. °· Only take over-the-counter or prescription medicines for pain, discomfort, or fever as directed by your health care provider. °· Keep all follow-up visits as told by your health care provider. This is important. °· Get follow-up  X-rays if required. The absence of pain does not always mean that the stone has passed. It may have only stopped moving. If the urine remains completely obstructed, it can cause loss of kidney function or even complete destruction of the kidney. It is your responsibility to make sure X-rays and follow-ups are completed. Ultrasounds of the kidney can show blockages and the status of the kidney. Ultrasounds are not associated with any radiation and can be performed easily in a matter of minutes. °· Make changes to your daily diet as told by your health care provider. You may be told to: °¨ Limit the amount of salt that you eat. °¨ Eat 5 or more servings of fruits and vegetables each day. °¨ Limit the amount of meat, poultry, fish, and eggs that you eat. °· Collect a 24-hour urine sample as told by your health care provider. You may need to collect another urine sample every 6-12 months. °SEEK MEDICAL CARE IF: °· You experience pain that is progressive and unresponsive to any pain medicine you have been prescribed. °SEEK IMMEDIATE MEDICAL CARE IF:  °· Pain cannot be controlled with the prescribed medicine. °· You have a fever or shaking chills. °· The severity or intensity of pain increases over 18 hours and is not relieved by pain medicine. °· You develop a new onset of abdominal pain. °· You feel faint or pass out. °· You are unable to urinate. °  °This information is not intended to replace advice given to you by your health care provider. Make sure you discuss any questions you have with your health care provider. °  °Document Released: 11/19/2005 Document Revised: 08/10/2015 Document Reviewed: 04/22/2013 °Elsevier Interactive Patient Education ©2016 [employer] ° °

## 2016-02-12 NOTE — ED Notes (Signed)
Patient presents to the ED with left lower quadrant abdominal pain x 3 days.  Patient reports vomiting x 2 yesterday.  Patient reports some constipation as well.  Patient states her last bm was Thursday or Friday.  Patient states she was seen for similar [facility] about 1 month ago and was diagnosed with diverticulitis.  Patient states she was placed on antibiotics but she did not finish the course of antibiotics stating that that antibiotics made her very nauseous.  Patient reports feeling anxious and nauseous at this time.

## 2016-02-12 NOTE — ED Provider Notes (Signed)
[facility] Emergency Department Provider Note  ____________________________________________  Time seen: 8:00 AM  I have reviewed the triage vital signs and the nursing notes.   HISTORY  Chief Complaint Abdominal Pain    HPI Briana Davis is a 25 y.o. female who complains of bilateral lower abdominal pain for the past few weeks along with nausea and vomiting today. She was seen at [facility] a few weeks ago diagnosed with diverticulitis and given Cipro and Flagyl. She states that she did not take the antibiotics as prescribed and her symptoms have not improved. Denies any vaginal discharge or bleeding, no urinary symptoms. No chest pain or shortness of breath. Pain is nonradiating, constant but waxing and waning overall moderate intensity.  LMP 01/28/2016     Past Medical History  Diagnosis Date  . Kidney stone   . Depression   . Suicidal ideation   . Aggression   . Alcohol consumption binge drinking   . Diverticulitis      Patient Active Problem List   Diagnosis Date Noted  . Attention deficit hyperactivity disorder (ADHD), combined type   . Alcohol dependence, binge pattern (HCC) 01/01/2016  . Attention deficit hyperactivity disorder (ADHD) 01/01/2016  . Bipolar 2 disorder (HCC) 01/01/2016  . Polysubstance dependence (HCC) 12/31/2015  . Suicidal ideations   . Depression, major, recurrent, moderate (HCC) 12/15/2015  . Social anxiety disorder 12/15/2015  . Mild alcohol abuse in early remission 12/15/2015  . Suicidal ideation 12/15/2015     Past Surgical History  Procedure Laterality Date  . Tonsillectomy    . Wisdom tooth extraction       Current Outpatient Rx  Name  Route  Sig  Dispense  Refill  . amphetamine-dextroamphetamine (ADDERALL XR) 10 MG 24 hr capsule   Oral   Take 1 capsule (10 mg total) by mouth 2 (two) times daily. For ADHD   16 capsule   0   . ciprofloxacin (CIPRO) 500 MG tablet      One po bid   28 tablet    0   . FLUoxetine (PROZAC) 40 MG capsule   Oral   Take 1 capsule (40 mg total) by mouth daily. For depression   30 capsule   0   . hydrOXYzine (ATARAX/VISTARIL) 25 MG tablet   Oral   Take 1 tablet (25 mg total) by mouth every 6 (six) hours as needed for anxiety.   60 tablet   0   . lamoTRIgine (LAMICTAL) 25 MG tablet   Oral   Take 1 tablet (25 mg total) by mouth daily. For mood stabilization   30 tablet   0   . metroNIDAZOLE (FLAGYL) 500 MG tablet   Oral   Take 1 tablet (500 mg total) by mouth 4 (four) times daily.   56 tablet   0   . naproxen (NAPROSYN) 500 MG tablet   Oral   Take 1 tablet (500 mg total) by mouth 2 (two) times daily with a meal.   14 tablet   0   . nicotine (NICODERM CQ - DOSED IN MG/24 HOURS) 21 mg/24hr patch   Transdermal   Place 1 patch (21 mg total) onto the skin daily. For smoking cessation Patient not taking: Reported on 01/12/2016   28 patch   0   . ondansetron (ZOFRAN ODT) 4 MG disintegrating tablet   Oral   Take 1 tablet (4 mg total) by mouth every 8 (eight) hours as needed for nausea or vomiting.  20 tablet   0   . oxyCODONE-acetaminophen (ROXICET) 5-325 MG tablet   Oral   Take 1 tablet by mouth every 6 (six) hours as needed for severe pain.   12 tablet   0   . traZODone (DESYREL) 100 MG tablet   Oral   Take 1 tablet (100 mg total) by mouth at bedtime as needed for sleep (may repeat x 1 in 1 hour if needed).   60 tablet   0      Allergies Latex and Amoxicillin   No family history on file.  Social History Social History  Substance Use Topics  . Smoking status: Current Every Day Smoker -- 1.00 packs/day    Types: Cigarettes  . Smokeless tobacco: None  . Alcohol Use: None     Comment: Sober for 20 days    Review of Systems  Constitutional:   No fever or chills. No weight changes Eyes:   No blurry vision or double vision.  ENT:   No sore throat.  Cardiovascular:   No chest pain. Respiratory:   No dyspnea or  cough. Gastrointestinal:   Positive abdominal pain with vomiting as above. Decreased bowel movements.  No BRBPR or melena. Genitourinary:   Negative for dysuria or difficulty urinating. Musculoskeletal:   Negative for back pain. No joint swelling or pain. Skin:   Negative for rash. Neurological:   Negative for headaches, focal weakness or numbness. Psychiatric:  No anxiety or depression.   Endocrine:  No changes in energy or sleep difficulty.  10-point ROS otherwise negative.  ____________________________________________   PHYSICAL EXAM:  VITAL SIGNS: ED Triage Vitals  Enc Vitals Group     BP 02/12/16 0753 134/83 mmHg     Pulse Rate 02/12/16 0753 128     Resp 02/12/16 0753 20     Temp 02/12/16 0753 97.7 F (36.5 C)     Temp Source 02/12/16 0753 Oral     SpO2 02/12/16 0753 100 %     Weight 02/12/16 0753 145 lb (65.772 kg)     Height 02/12/16 0753 5\' 5"  (1.651 m)     Head Cir --      Peak Flow --      Pain Score 02/12/16 0759 7     Pain Loc --      Pain Edu? --      Excl. in GC? --     Vital signs reviewed, nursing assessments reviewed.   Constitutional:   Alert and oriented. Well appearing and in no distress. Eyes:   No scleral icterus. No conjunctival pallor. PERRL. EOMI ENT   Head:   Normocephalic and atraumatic.   Nose:   No congestion/rhinnorhea. No septal hematoma   Mouth/Throat:   MMM, no pharyngeal erythema. No peritonsillar mass.    Neck:   No stridor. No SubQ emphysema. No meningismus. Hematological/Lymphatic/Immunilogical:   No cervical lymphadenopathy. Cardiovascular:   RRR. Symmetric bilateral radial and DP pulses.  No murmurs.  Respiratory:   Normal respiratory effort without tachypnea nor retractions. Breath sounds are clear and equal bilaterally. No wheezes/rales/rhonchi. Gastrointestinal:   SoftWith left upper quadrant right lower quadrant and left lower quadrant tenderness. Worse in the left upper quadrant. Non distended. There is no CVA  tenderness.  No rebound, rigidity, or guarding. Genitourinary:   deferred Musculoskeletal:   Nontender with normal range of motion in all extremities. No joint effusions.  No lower extremity tenderness.  No edema. Neurologic:   Normal speech and language.  CN 2-10 normal.  Motor grossly intact. No gross focal neurologic deficits are appreciated.  Skin:    Skin is warm, dry and intact. No rash noted.  No petechiae, purpura, or bullae. Psychiatric:   Mood and affect are normal. ____________________________________________    LABS (pertinent positives/negatives) (all labs ordered are listed, but only abnormal results are displayed) Labs Reviewed  COMPREHENSIVE METABOLIC PANEL - Abnormal; Notable for the following:    Potassium 3.3 (*)    All other components within normal limits  CBC WITH DIFFERENTIAL/PLATELET - Abnormal; Notable for the following:    Monocytes Absolute 1.6 (*)    All other components within normal limits  URINALYSIS COMPLETEWITH MICROSCOPIC ([facility] ONLY) - Abnormal; Notable for the following:    Color, Urine YELLOW (*)    APPearance CLEAR (*)    Hgb urine dipstick 2+ (*)    Squamous Epithelial / LPF 0-5 (*)    All other components within normal limits  LIPASE, BLOOD  POC URINE PREG, ED  POCT PREGNANCY, URINE   ____________________________________________   EKG    ____________________________________________    RADIOLOGY  CT abdomen and pelvis shows no diverticulitis, normal appendix. She does have left sided kidney stone which is consistent with her symptoms.  ____________________________________________   PROCEDURES   ____________________________________________   INITIAL IMPRESSION / ASSESSMENT AND PLAN / ED COURSE  Pertinent labs & imaging results that were available during my care of the patient were reviewed by me and considered in my medical decision making (see chart for details).  Patient presents with multifocal abdominal pain with  vomiting, recent diagnosis of diverticulitis. I reviewed the CT scan report and images from [facility] on 01/12/2016. It shows a single diverticulum arising from the cecum that was inflamed at that time and reported normal appendix. This is an unusual location and appearance with her persistent symptoms And tachycardia will get a repeat CT scan with full contrast to further evaluate. Labs urine, IV fluids, IV morphine and Zofran.   ----------------------------------------- 11:33 AM on 02/12/2016 -----------------------------------------  Workup essentially negative. She does have a kidney stone which explains her symptoms and the area of her tenderness on exam primarily. Hemodynamically stable, vital signs unremarkable except for an initial tachycardia which seems to have resolved with fluids and pain control. She is very comfortable and sitting upright now. I will prescribed Percocet and Zofran and naproxen for now, follow with primary care. Low suspicion for STI PID TOA or torsion. ____________________________________________   FINAL CLINICAL IMPRESSION(S) / ED DIAGNOSES  Final diagnoses:  Renal colic  Kidney stone on left side      Sharman Cheek, MD 02/12/16 1135

## 2016-05-04 ENCOUNTER — Emergency Department
Admission: EM | Admit: 2016-05-04 | Discharge: 2016-05-05 | Disposition: A | Discharge status: Home / Self Care | Payer: Self-pay | Attending: Emergency Medicine | Admitting: Emergency Medicine

## 2016-05-04 ENCOUNTER — Encounter: Payer: Self-pay | Admitting: Emergency Medicine

## 2016-05-04 DIAGNOSIS — F1020 Alcohol dependence, uncomplicated: Secondary | ICD-10-CM | POA: Diagnosis present

## 2016-05-04 DIAGNOSIS — F10929 Alcohol use, unspecified with intoxication, unspecified: Secondary | ICD-10-CM

## 2016-05-04 DIAGNOSIS — F17210 Nicotine dependence, cigarettes, uncomplicated: Secondary | ICD-10-CM | POA: Insufficient documentation

## 2016-05-04 DIAGNOSIS — F10220 Alcohol dependence with intoxication, uncomplicated: Secondary | ICD-10-CM | POA: Insufficient documentation

## 2016-05-04 DIAGNOSIS — F329 Major depressive disorder, single episode, unspecified: Secondary | ICD-10-CM | POA: Insufficient documentation

## 2016-05-04 DIAGNOSIS — Z046 Encounter for general psychiatric examination, requested by authority: Secondary | ICD-10-CM

## 2016-05-04 DIAGNOSIS — R45851 Suicidal ideations: Secondary | ICD-10-CM

## 2016-05-04 DIAGNOSIS — F1994 Other psychoactive substance use, unspecified with psychoactive substance-induced mood disorder: Secondary | ICD-10-CM

## 2016-05-04 DIAGNOSIS — F1920 Other psychoactive substance dependence, uncomplicated: Secondary | ICD-10-CM | POA: Insufficient documentation

## 2016-05-04 DIAGNOSIS — F902 Attention-deficit hyperactivity disorder, combined type: Secondary | ICD-10-CM | POA: Insufficient documentation

## 2016-05-04 LAB — SALICYLATE LEVEL: SALICYLATE LVL: 8 mg/dL (ref 2.8–30.0)

## 2016-05-04 LAB — URINE DRUG SCREEN, QUALITATIVE (ARMC ONLY)
Amphetamines, Ur Screen: NOT DETECTED
BARBITURATES, UR SCREEN: NOT DETECTED
Benzodiazepine, Ur Scrn: POSITIVE — AB
CANNABINOID 50 NG, UR ~~LOC~~: NOT DETECTED
COCAINE METABOLITE, UR ~~LOC~~: NOT DETECTED
MDMA (ECSTASY) UR SCREEN: NOT DETECTED
Methadone Scn, Ur: NOT DETECTED
OPIATE, UR SCREEN: NOT DETECTED
PHENCYCLIDINE (PCP) UR S: NOT DETECTED
TRICYCLIC, UR SCREEN: NOT DETECTED

## 2016-05-04 LAB — COMPREHENSIVE METABOLIC PANEL
ALBUMIN: 4.5 g/dL (ref 3.5–5.0)
ALK PHOS: 63 U/L (ref 38–126)
ALT: 14 U/L (ref 14–54)
AST: 19 U/L (ref 15–41)
Anion gap: 11 (ref 5–15)
BUN: 8 mg/dL (ref 6–20)
CALCIUM: 9.2 mg/dL (ref 8.9–10.3)
CHLORIDE: 112 mmol/L — AB (ref 101–111)
CO2: 20 mmol/L — AB (ref 22–32)
CREATININE: 0.53 mg/dL (ref 0.44–1.00)
GFR calc non Af Amer: >60 mL/min (ref >60)
GLUCOSE: 106 mg/dL — AB (ref 65–99)
Potassium: 3.3 mmol/L — ABNORMAL LOW (ref 3.5–5.1)
Sodium: 143 mmol/L (ref 135–145)
Total Bilirubin: <0.1 mg/dL — ABNORMAL LOW (ref 0.3–1.2)
Total Protein: 7.9 g/dL (ref 6.5–8.1)

## 2016-05-04 LAB — CBC
HEMATOCRIT: 39.7 % (ref 35.0–47.0)
HEMOGLOBIN: 13.7 g/dL (ref 12.0–16.0)
MCH: 31.6 pg (ref 26.0–34.0)
MCHC: 34.5 g/dL (ref 32.0–36.0)
MCV: 91.5 fL (ref 80.0–100.0)
Platelets: 506 10*3/uL — ABNORMAL HIGH (ref 150–440)
RBC: 4.33 MIL/uL (ref 3.80–5.20)
RDW: 12.8 % (ref 11.5–14.5)
WBC: 11.6 10*3/uL — ABNORMAL HIGH (ref 3.6–11.0)

## 2016-05-04 LAB — ETHANOL: Alcohol, Ethyl (B): 282 mg/dL — ABNORMAL HIGH (ref <5)

## 2016-05-04 LAB — ACETAMINOPHEN LEVEL: Acetaminophen (Tylenol), Serum: <10 ug/mL — ABNORMAL LOW (ref 10–30)

## 2016-05-04 NOTE — ED Notes (Signed)
Pt was dressed out Briana SolianSarah Wakeelah Davis, EDT (me). Pt belongings included the following: (1) bra, (1) underwear, (1) pair of socks, (1) hair band, (1) bobbie pen, (1) t' shirt and (1) shorts, & (1) set of keys that the pt described as her house keys. The patients belongings were placed in her labeled belonging bag and handed off to the [employer] officer who brought her in as they await placement in the quad. Pt was more calm and cooperative during the dressing change process.

## 2016-05-04 NOTE — ED Notes (Addendum)
POCT RESULTS WERE NEGATIVE, urine and blood was sent to the lab
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
## 2016-05-04 NOTE — ED Notes (Signed)
Pt with [employer] for etoh intoxication. [employer] took ivc papers out on pt for etoh intoxication. Pt denies si or hi. Pt loud and disruptive in triage.

## 2016-05-05 ENCOUNTER — Emergency Department: Payer: Self-pay

## 2016-05-05 DIAGNOSIS — Z046 Encounter for general psychiatric examination, requested by authority: Secondary | ICD-10-CM

## 2016-05-05 DIAGNOSIS — F1994 Other psychoactive substance use, unspecified with psychoactive substance-induced mood disorder: Secondary | ICD-10-CM

## 2016-05-05 MED ORDER — IBUPROFEN 600 MG PO TABS
600.0000 mg | ORAL_TABLET | Freq: Once | ORAL | Status: AC
  Administered 2016-05-05 (×2): 600 mg via ORAL

## 2016-05-05 MED ORDER — IBUPROFEN 600 MG PO TABS
ORAL_TABLET | ORAL | Status: AC
  Administered 2016-05-05: 600 mg via ORAL
  Filled 2016-05-05: qty 1

## 2016-05-05 NOTE — ED Notes (Signed)
Patient alert and oriented, Nurse took patient coloring books and crayons. Patient states that she is bored, and will color. Patient states she hopes to go home. Patient has insight into the issue with the alcohol and states she will start back her 12 step program. q15 min checks, camera monitoring.

## 2016-05-05 NOTE — ED Notes (Signed)
Patient with discharge orders, she voices understanding of instructions. Patient states she is going to start back [facility] and [facility]. Patient without any signs of distress, and glad to be discharged. Patient's belongings given to her. Patient ambulated out and transported home per sister.

## 2016-05-05 NOTE — ED Notes (Signed)
Patient will be discharged today, awaiting discharge instructions.

## 2016-05-05 NOTE — Discharge Instructions (Signed)
Alcohol Intoxication  Alcohol intoxication occurs when the amount of alcohol that a person has consumed impairs his or her ability to mentally and physically function. Alcohol directly impairs the normal chemical activity of the brain. Drinking large amounts of alcohol can lead to changes in mental function and behavior, and it can cause many physical effects that can be harmful.   Alcohol intoxication can range in severity from mild to very severe. Various factors can affect the level of intoxication that occurs, such as the person's age, gender, weight, frequency of alcohol consumption, and the presence of other medical conditions (such as diabetes, seizures, or heart conditions). Dangerous levels of alcohol intoxication may occur when people drink large amounts of alcohol in a short period (binge drinking). Alcohol can also be especially dangerous when combined with certain prescription medicines or "recreational" drugs.  SIGNS AND SYMPTOMS  Some common signs and symptoms of mild alcohol intoxication include:  · Loss of coordination.  · Changes in mood and behavior.  · Impaired judgment.  · Slurred speech.  As alcohol intoxication progresses to more severe levels, other signs and symptoms will appear. These may include:  · Vomiting.  · Confusion and impaired memory.  · Slowed breathing.  · Seizures.  · Loss of consciousness.  DIAGNOSIS   Your health care provider will take a medical history and perform a physical exam. You will be asked about the amount and type of alcohol you have consumed. Blood tests will be done to measure the concentration of alcohol in your blood. In many places, your blood alcohol level must be lower than 80 mg/dL (0.08%) to legally drive. However, many dangerous effects of alcohol can occur at much lower levels.   TREATMENT   People with alcohol intoxication often do not require treatment. Most of the effects of alcohol intoxication are temporary, and they go away as the alcohol naturally  leaves the body. Your health care provider will monitor your condition until you are stable enough to go home. Fluids are sometimes given through an IV access tube to help prevent dehydration.   HOME CARE INSTRUCTIONS  · Do not drive after drinking alcohol.  · Stay hydrated. Drink enough water and fluids to keep your urine clear or pale yellow. Avoid caffeine.    · Only take over-the-counter or prescription medicines as directed by your health care provider.    SEEK MEDICAL CARE IF:   · You have persistent vomiting.    · You do not feel better after a few days.  · You have frequent alcohol intoxication. Your health care provider can help determine if you should see a substance use treatment counselor.  SEEK IMMEDIATE MEDICAL CARE IF:   · You become shaky or tremble when you try to stop drinking.    · You shake uncontrollably (seizure).    · You throw up (vomit) blood. This may be bright red or may look like black coffee grounds.    · You have blood in your stool. This may be bright red or may appear as a black, tarry, bad smelling stool.    · You become lightheaded or faint.    MAKE SURE YOU:   · Understand these instructions.  · Will watch your condition.  · Will get help right away if you are not doing well or get worse.     This information is not intended to replace advice given to you by your health care provider. Make sure you discuss any questions you have with your health care provider.       Document Released: 08/29/2005 Document Revised: 07/22/2013 Document Reviewed: 04/24/2013  Elsevier Interactive Patient Education ©2016 [employer]

## 2016-05-05 NOTE — ED Notes (Signed)
Patient alert and oriented, Patient denies Si/Hi, states that the drinking had caused her to act like that, she states that she knows she cannot drink, but keeps trying to still drink, she had been sober for almost 6 months, states she goes to [facility] Wants to continue with the 12 step program, and keep taking her medicine and she would be ok. She states that she stopped taking her medicine a few weeks ago and she states " I have to take it for my moods" Patient is pressured in speech, she talks about her mom and that her mom is a alcoholic, but she knows that she needs to think of herself and stop comparing herself to her mom. Patient has insight into her needs, but has hard time achieving her goals , and states " I cannot believe that I am going to have watch for triggers all the time" patient did become teary eyed a few times during conversation. Nurse will continue to monitor. q 15 min.checks and camera monitoring in progress.
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
## 2016-05-05 NOTE — ED Notes (Signed)
Pt. To BHU from ED ambulatory without difficulty, to room  BHU-5. Report from Amy RN. Pt. Is alert and oriented, warm and dry in no distress. Pt. Denies SI, HI, and AVH. Pt. Calm and cooperative. Pt. Made aware of security cameras and Q15 minute rounds. Pt. Encouraged to let Nursing staff know of any concerns or needs.

## 2016-05-05 NOTE — ED Provider Notes (Signed)
[facility] Emergency Department Provider Note  ____________________________________________  Time seen: 11:30 PM  I have reviewed the triage vital signs and the nursing notes.   HISTORY  Chief Complaint Alcohol Intoxication      HPI Briana Davis is a 25 y.o. female presents involuntarily committed for threats of self-harm. Patient denies any suicidal ideation at this time. She does admit to heavy EtOH intake tonight and over the course of the day. Patient denies any homicidal ideation. She states that she may have drank too much".    Past Medical History  Diagnosis Date  . Kidney stone   . Depression   . Suicidal ideation   . Aggression   . Alcohol consumption binge drinking   . Diverticulitis     Patient Active Problem List   Diagnosis Date Noted  . Attention deficit hyperactivity disorder (ADHD), combined type   . Alcohol dependence, binge pattern (HCC) 01/01/2016  . Attention deficit hyperactivity disorder (ADHD) 01/01/2016  . Bipolar 2 disorder (HCC) 01/01/2016  . Polysubstance dependence (HCC) 12/31/2015  . Suicidal ideations   . Depression, major, recurrent, moderate (HCC) 12/15/2015  . Social anxiety disorder 12/15/2015  . Mild alcohol abuse in early remission 12/15/2015  . Suicidal ideation 12/15/2015    Past Surgical History  Procedure Laterality Date  . Tonsillectomy    . Wisdom tooth extraction      Current Outpatient Rx  Name  Route  Sig  Dispense  Refill  . amphetamine-dextroamphetamine (ADDERALL XR) 10 MG 24 hr capsule   Oral   Take 1 capsule (10 mg total) by mouth 2 (two) times daily. For ADHD   16 capsule   0   . ciprofloxacin (CIPRO) 500 MG tablet      One po bid   28 tablet   0   . FLUoxetine (PROZAC) 40 MG capsule   Oral   Take 1 capsule (40 mg total) by mouth daily. For depression   30 capsule   0   . hydrOXYzine (ATARAX/VISTARIL) 25 MG tablet   Oral   Take 1 tablet (25 mg total) by mouth every 6  (six) hours as needed for anxiety.   60 tablet   0   . lamoTRIgine (LAMICTAL) 25 MG tablet   Oral   Take 1 tablet (25 mg total) by mouth daily. For mood stabilization   30 tablet   0   . metroNIDAZOLE (FLAGYL) 500 MG tablet   Oral   Take 1 tablet (500 mg total) by mouth 4 (four) times daily.   56 tablet   0   . naproxen (NAPROSYN) 500 MG tablet   Oral   Take 1 tablet (500 mg total) by mouth 2 (two) times daily with a meal.   14 tablet   0   . nicotine (NICODERM CQ - DOSED IN MG/24 HOURS) 21 mg/24hr patch   Transdermal   Place 1 patch (21 mg total) onto the skin daily. For smoking cessation Patient not taking: Reported on 01/12/2016   28 patch   0   . ondansetron (ZOFRAN ODT) 4 MG disintegrating tablet   Oral   Take 1 tablet (4 mg total) by mouth every 8 (eight) hours as needed for nausea or vomiting.   20 tablet   0   . oxyCODONE-acetaminophen (ROXICET) 5-325 MG tablet   Oral   Take 1 tablet by mouth every 6 (six) hours as needed for severe pain.   12 tablet   0   .  traZODone (DESYREL) 100 MG tablet   Oral   Take 1 tablet (100 mg total) by mouth at bedtime as needed for sleep (may repeat x 1 in 1 hour if needed).   60 tablet   0     Allergies Latex and Amoxicillin  No family history on file.  Social History Social History  Substance Use Topics  . Smoking status: Current Every Day Smoker -- 1.00 packs/day    Types: Cigarettes  . Smokeless tobacco: Never Used  . Alcohol Use: Yes     Comment: Sober for 20 days    Review of Systems  Constitutional: Negative for fever. Eyes: Negative for visual changes. ENT: Negative for sore throat. Cardiovascular: Negative for chest pain. Respiratory: Negative for shortness of breath. Gastrointestinal: Negative for abdominal pain, vomiting and diarrhea. Genitourinary: Negative for dysuria. Musculoskeletal: Negative for back pain. Skin: Negative for rash. Neurological: Negative for headaches, focal weakness or  numbness.   10-point ROS otherwise negative.  ____________________________________________   PHYSICAL EXAM:  VITAL SIGNS: ED Triage Vitals  Enc Vitals Group     BP 05/04/16 2027 131/76 mmHg     Pulse Rate 05/04/16 2027 116     Resp 05/04/16 2027 24     Temp 05/04/16 2027 97.7 F (36.5 C)     Temp Source 05/04/16 2027 Oral     SpO2 05/04/16 2027 100 %     Weight 05/04/16 2027 143 lb (64.864 kg)     Height 05/04/16 2027  (1.651 m)     Head Cir --      Peak Flow --      Pain Score 05/04/16 2200 7     Pain Loc --      Pain Edu? --      Excl. in GC? --      Constitutional: Alert and oriented. Well appearing and in no distress. Eyes: Conjunctivae are normal. PERRL. Normal extraocular movements. ENT   Head: Normocephalic and atraumatic.   Nose: No congestion/rhinnorhea.   Mouth/Throat: Mucous membranes are moist.   Neck: No stridor. Hematological/Lymphatic/Immunilogical: No cervical lymphadenopathy. Cardiovascular: Normal rate, regular rhythm. Normal and symmetric distal pulses are present in all extremities. No murmurs, rubs, or gallops. Respiratory: Normal respiratory effort without tachypnea nor retractions. Breath sounds are clear and equal bilaterally. No wheezes/rales/rhonchi. Gastrointestinal: Soft and nontender. No distention. There is no CVA tenderness. Genitourinary: deferred Musculoskeletal: Nontender with normal range of motion in all extremities. No joint effusions.  No lower extremity tenderness nor edema. Neurologic:  Normal speech and language. No gross focal neurologic deficits are appreciated. Speech is normal.  Skin:  Skin is warm, dry and intact. No rash noted. Psychiatric: Mood and affect are normal. Speech and behavior are normal. Patient exhibits appropriate insight and judgment.  ____________________________________________    LABS (pertinent positives/negatives)  Labs Reviewed  COMPREHENSIVE METABOLIC PANEL - Abnormal; Notable  for the following:    Potassium 3.3 (*)    Chloride 112 (*)    CO2 20 (*)    Glucose, Bld 106 (*)    Total Bilirubin <0.1 (*)    All other components within normal limits  ETHANOL - Abnormal; Notable for the following:    Alcohol, Ethyl (B) 282 (*)    All other components within normal limits  ACETAMINOPHEN LEVEL - Abnormal; Notable for the following:    Acetaminophen (Tylenol), Serum <10 (*)    All other components within normal limits  CBC - Abnormal; Notable for the following:  WBC 11.6 (*)    Platelets 506 (*)    All other components within normal limits  URINE DRUG SCREEN, QUALITATIVE ([facility] ONLY) - Abnormal; Notable for the following:    Benzodiazepine, Ur Scrn POSITIVE (*)    All other components within normal limits  SALICYLATE LEVEL  POC URINE PREG, ED        INITIAL IMPRESSION / ASSESSMENT AND PLAN / ED COURSE  Pertinent labs & imaging results that were available during my care of the patient were reviewed by me and considered in my medical decision making (see chart for details).  Await psychiatry consultation  ____________________________________________   FINAL CLINICAL IMPRESSION(S) / ED DIAGNOSES  Final diagnoses:  Alcohol intoxication, with unspecified complication (HCC)  Polysubstance dependence (HCC)      Darci Current, MD 05/05/16 [phone]

## 2016-05-05 NOTE — ED Provider Notes (Signed)
-----------------------------------------   2:58 PM on 05/05/2016 -----------------------------------------   Blood pressure 116/75, pulse 108, temperature 97.6 F (36.4 C), temperature source Oral, resp. rate 16, height 5\' 5"  (1.651 m), weight 143 lb (64.864 kg), last menstrual period 05/04/2016, SpO2 100 %.  The patient had no acute events since last update.  Calm and cooperative at this time.  Disposition is pending per Psychiatry/Behavioral Medicine team recommendations.   Patient was seen and examined by psychiatry Dr.Claypacs , and the patient's psychiatric condition seem to be related to alcohol intoxication. Patient is followed on an outpatient basis and [facility] [employer] [facility]. She'll be discharged home.  Jennye MoccasinBrian S Sadee Osland, MD 05/05/16 [id number]

## 2016-05-05 NOTE — ED Notes (Signed)
Patient talked with Dr. Toni Amendlapacs.

## 2016-08-03 ENCOUNTER — Inpatient Hospital Stay
Admission: EM | Admit: 2016-08-03 | Discharge: 2016-08-03 | DRG: 918 | Payer: No Typology Code available for payment source | Attending: Internal Medicine | Admitting: Internal Medicine

## 2016-08-03 ENCOUNTER — Inpatient Hospital Stay
Admission: RE | Admit: 2016-08-03 | Discharge: 2016-08-06 | DRG: 885 | Disposition: A | Discharge status: Home / Self Care | Payer: No Typology Code available for payment source | Source: Intra-hospital | Attending: Psychiatry | Admitting: Psychiatry

## 2016-08-03 ENCOUNTER — Encounter: Payer: Self-pay | Admitting: Emergency Medicine

## 2016-08-03 DIAGNOSIS — Z91040 Latex allergy status: Secondary | ICD-10-CM | POA: Diagnosis not present

## 2016-08-03 DIAGNOSIS — Z9889 Other specified postprocedural states: Secondary | ICD-10-CM

## 2016-08-03 DIAGNOSIS — F1020 Alcohol dependence, uncomplicated: Secondary | ICD-10-CM | POA: Diagnosis present

## 2016-08-03 DIAGNOSIS — Z915 Personal history of self-harm: Secondary | ICD-10-CM

## 2016-08-03 DIAGNOSIS — Z046 Encounter for general psychiatric examination, requested by authority: Secondary | ICD-10-CM

## 2016-08-03 DIAGNOSIS — R45851 Suicidal ideations: Secondary | ICD-10-CM | POA: Diagnosis present

## 2016-08-03 DIAGNOSIS — Z8249 Family history of ischemic heart disease and other diseases of the circulatory system: Secondary | ICD-10-CM

## 2016-08-03 DIAGNOSIS — F1010 Alcohol abuse, uncomplicated: Secondary | ICD-10-CM | POA: Diagnosis present

## 2016-08-03 DIAGNOSIS — Z9119 Patient's noncompliance with other medical treatment and regimen: Secondary | ICD-10-CM

## 2016-08-03 DIAGNOSIS — Z9114 Patient's other noncompliance with medication regimen: Secondary | ICD-10-CM | POA: Diagnosis not present

## 2016-08-03 DIAGNOSIS — F3181 Bipolar II disorder: Secondary | ICD-10-CM | POA: Diagnosis present

## 2016-08-03 DIAGNOSIS — T39012A Poisoning by aspirin, intentional self-harm, initial encounter: Principal | ICD-10-CM | POA: Diagnosis present

## 2016-08-03 DIAGNOSIS — F909 Attention-deficit hyperactivity disorder, unspecified type: Secondary | ICD-10-CM | POA: Diagnosis present

## 2016-08-03 DIAGNOSIS — Z79899 Other long term (current) drug therapy: Secondary | ICD-10-CM

## 2016-08-03 DIAGNOSIS — T50902A Poisoning by unspecified drugs, medicaments and biological substances, intentional self-harm, initial encounter: Secondary | ICD-10-CM

## 2016-08-03 DIAGNOSIS — Z880 Allergy status to penicillin: Secondary | ICD-10-CM

## 2016-08-03 DIAGNOSIS — D72829 Elevated white blood cell count, unspecified: Secondary | ICD-10-CM | POA: Diagnosis present

## 2016-08-03 DIAGNOSIS — F17210 Nicotine dependence, cigarettes, uncomplicated: Secondary | ICD-10-CM | POA: Diagnosis present

## 2016-08-03 DIAGNOSIS — F1994 Other psychoactive substance use, unspecified with psychoactive substance-induced mood disorder: Secondary | ICD-10-CM | POA: Diagnosis present

## 2016-08-03 DIAGNOSIS — T39091A Poisoning by salicylates, accidental (unintentional), initial encounter: Secondary | ICD-10-CM | POA: Diagnosis present

## 2016-08-03 DIAGNOSIS — Z87442 Personal history of urinary calculi: Secondary | ICD-10-CM

## 2016-08-03 DIAGNOSIS — F10920 Alcohol use, unspecified with intoxication, uncomplicated: Secondary | ICD-10-CM

## 2016-08-03 DIAGNOSIS — E876 Hypokalemia: Secondary | ICD-10-CM | POA: Diagnosis present

## 2016-08-03 DIAGNOSIS — T50901A Poisoning by unspecified drugs, medicaments and biological substances, accidental (unintentional), initial encounter: Secondary | ICD-10-CM | POA: Diagnosis present

## 2016-08-03 DIAGNOSIS — F10120 Alcohol abuse with intoxication, uncomplicated: Secondary | ICD-10-CM | POA: Diagnosis present

## 2016-08-03 LAB — COMPREHENSIVE METABOLIC PANEL
ALT: 15 U/L (ref 14–54)
AST: 17 U/L (ref 15–41)
Albumin: 4.3 g/dL (ref 3.5–5.0)
Alkaline Phosphatase: 60 U/L (ref 38–126)
Anion gap: 10 (ref 5–15)
BILIRUBIN TOTAL: 0.2 mg/dL — AB (ref 0.3–1.2)
BUN: 11 mg/dL (ref 6–20)
CO2: 22 mmol/L (ref 22–32)
CREATININE: 0.42 mg/dL — AB (ref 0.44–1.00)
Calcium: 9.4 mg/dL (ref 8.9–10.3)
Chloride: 109 mmol/L (ref 101–111)
GFR calc Af Amer: >60 mL/min (ref >60)
Glucose, Bld: 90 mg/dL (ref 65–99)
POTASSIUM: 3.3 mmol/L — AB (ref 3.5–5.1)
Sodium: 141 mmol/L (ref 135–145)
TOTAL PROTEIN: 7.8 g/dL (ref 6.5–8.1)

## 2016-08-03 LAB — URINALYSIS COMPLETE WITH MICROSCOPIC (ARMC ONLY)
Bilirubin Urine: NEGATIVE
Glucose, UA: NEGATIVE mg/dL
Ketones, ur: NEGATIVE mg/dL
Leukocytes, UA: NEGATIVE
Nitrite: NEGATIVE
Protein, ur: NEGATIVE mg/dL
Specific Gravity, Urine: 1.012 (ref 1.005–1.030)
pH: 5 (ref 5.0–8.0)

## 2016-08-03 LAB — BASIC METABOLIC PANEL
Anion gap: 6 (ref 5–15)
BUN: 9 mg/dL (ref 6–20)
CALCIUM: 8.1 mg/dL — AB (ref 8.9–10.3)
CO2: 28 mmol/L (ref 22–32)
CREATININE: 0.43 mg/dL — AB (ref 0.44–1.00)
Chloride: 107 mmol/L (ref 101–111)
GFR calc non Af Amer: >60 mL/min (ref >60)
Glucose, Bld: 95 mg/dL (ref 65–99)
Potassium: 3.1 mmol/L — ABNORMAL LOW (ref 3.5–5.1)
SODIUM: 141 mmol/L (ref 135–145)

## 2016-08-03 LAB — CBC
HEMATOCRIT: 38.6 % (ref 35.0–47.0)
Hemoglobin: 13.4 g/dL (ref 12.0–16.0)
MCH: 32.3 pg (ref 26.0–34.0)
MCHC: 34.7 g/dL (ref 32.0–36.0)
MCV: 93.1 fL (ref 80.0–100.0)
Platelets: 472 10*3/uL — ABNORMAL HIGH (ref 150–440)
RBC: 4.15 MIL/uL (ref 3.80–5.20)
RDW: 13.8 % (ref 11.5–14.5)
WBC: 14 10*3/uL — AB (ref 3.6–11.0)

## 2016-08-03 LAB — URINE DRUG SCREEN, QUALITATIVE (ARMC ONLY)
Amphetamines, Ur Screen: NOT DETECTED
BARBITURATES, UR SCREEN: NOT DETECTED
Benzodiazepine, Ur Scrn: POSITIVE — AB
CANNABINOID 50 NG, UR ~~LOC~~: NOT DETECTED
Cocaine Metabolite,Ur ~~LOC~~: NOT DETECTED
MDMA (Ecstasy)Ur Screen: NOT DETECTED
Methadone Scn, Ur: NOT DETECTED
Opiate, Ur Screen: NOT DETECTED
Phencyclidine (PCP) Ur S: NOT DETECTED
TRICYCLIC, UR SCREEN: NOT DETECTED

## 2016-08-03 LAB — ETHANOL: ALCOHOL ETHYL (B): 158 mg/dL — AB (ref <5)

## 2016-08-03 LAB — MAGNESIUM: MAGNESIUM: 2.3 mg/dL (ref 1.7–2.4)

## 2016-08-03 LAB — POCT PREGNANCY, URINE: Preg Test, Ur: NEGATIVE

## 2016-08-03 LAB — SALICYLATE LEVEL
SALICYLATE LVL: 27 mg/dL (ref 2.8–30.0)
SALICYLATE LVL: 8.9 mg/dL (ref 2.8–30.0)
Salicylate Lvl: 28.2 mg/dL (ref 2.8–30.0)
Salicylate Lvl: 33.4 mg/dL (ref 2.8–30.0)

## 2016-08-03 LAB — ACETAMINOPHEN LEVEL

## 2016-08-03 LAB — GLUCOSE, CAPILLARY: Glucose-Capillary: 104 mg/dL — ABNORMAL HIGH (ref 65–99)

## 2016-08-03 LAB — MRSA PCR SCREENING: MRSA BY PCR: NEGATIVE

## 2016-08-03 MED ORDER — STERILE WATER FOR INJECTION IV SOLN
INTRAVENOUS | Status: DC
  Administered 2016-08-03: 07:00:00 via INTRAVENOUS
  Filled 2016-08-03 (×2): qty 850

## 2016-08-03 MED ORDER — SENNOSIDES-DOCUSATE SODIUM 8.6-50 MG PO TABS: 1.0000 | ORAL_TABLET | Freq: Every evening | ORAL | Status: DC | PRN

## 2016-08-03 MED ORDER — SODIUM CHLORIDE 0.9% FLUSH: 3.0000 mL | Freq: Two times a day (BID) | INTRAVENOUS | Status: DC

## 2016-08-03 MED ORDER — ACTIDOSE WITH SORBITOL 50 GM/240ML PO LIQD
50.0000 g | Freq: Once | ORAL | Status: DC
  Administered 2016-08-03: 50 g via ORAL
  Filled 2016-08-03: qty 240

## 2016-08-03 MED ORDER — SODIUM BICARBONATE 8.4 % IV SOLN
INTRAVENOUS | Status: AC
  Administered 2016-08-03: 100 meq via INTRAVENOUS
  Filled 2016-08-03: qty 100

## 2016-08-03 MED ORDER — SODIUM CHLORIDE 0.9 % IV BOLUS (SEPSIS)
1000.0000 mL | Freq: Once | INTRAVENOUS | Status: AC
  Administered 2016-08-03: 1000 mL via INTRAVENOUS

## 2016-08-03 MED ORDER — POTASSIUM CHLORIDE CRYS ER 20 MEQ PO TBCR
20.0000 meq | EXTENDED_RELEASE_TABLET | Freq: Once | ORAL | Status: AC
  Administered 2016-08-03: 20 meq via ORAL
  Filled 2016-08-03: qty 1

## 2016-08-03 MED ORDER — ONDANSETRON HCL 4 MG PO TABS: 4.0000 mg | ORAL_TABLET | Freq: Four times a day (QID) | ORAL | Status: DC | PRN

## 2016-08-03 MED ORDER — LAMOTRIGINE 25 MG PO TABS
25.0000 mg | ORAL_TABLET | Freq: Every day | ORAL | Status: DC
  Administered 2016-08-03: 25 mg via ORAL
  Filled 2016-08-03: qty 1

## 2016-08-03 MED ORDER — SODIUM CHLORIDE 0.9% FLUSH
3.0000 mL | Freq: Two times a day (BID) | INTRAVENOUS | Status: DC
  Administered 2016-08-03: 3 mL via INTRAVENOUS

## 2016-08-03 MED ORDER — ONDANSETRON HCL 4 MG/2ML IJ SOLN
4.0000 mg | Freq: Four times a day (QID) | INTRAMUSCULAR | Status: DC | PRN
  Filled 2016-08-03: qty 2

## 2016-08-03 MED ORDER — SODIUM BICARBONATE 8.4 % IV SOLN
100.0000 meq | Freq: Once | INTRAVENOUS | Status: AC
  Administered 2016-08-03: 100 meq via INTRAVENOUS

## 2016-08-03 MED ORDER — POTASSIUM CHLORIDE CRYS ER 20 MEQ PO TBCR
40.0000 meq | EXTENDED_RELEASE_TABLET | ORAL | Status: AC
Start: 2016-08-03 — End: 2016-08-03
  Administered 2016-08-03: 40 meq via ORAL
  Filled 2016-08-03: qty 2

## 2016-08-03 MED ORDER — ACETAMINOPHEN 325 MG PO TABS
650.0000 mg | ORAL_TABLET | Freq: Four times a day (QID) | ORAL | Status: DC | PRN
  Administered 2016-08-04 – 2016-08-06 (×6): 650 mg via ORAL
  Filled 2016-08-03 (×7): qty 2

## 2016-08-03 MED ORDER — ACETAMINOPHEN 650 MG RE SUPP
650.0000 mg | Freq: Four times a day (QID) | RECTAL | Status: DC | PRN
  Filled 2016-08-03: qty 1

## 2016-08-03 MED ORDER — ACETAMINOPHEN 650 MG RE SUPP: 650.0000 mg | Freq: Four times a day (QID) | RECTAL | Status: DC | PRN

## 2016-08-03 MED ORDER — ACETAMINOPHEN 325 MG PO TABS: 650.0000 mg | ORAL_TABLET | Freq: Four times a day (QID) | ORAL | Status: DC | PRN

## 2016-08-03 MED ORDER — POTASSIUM CHLORIDE CRYS ER 20 MEQ PO TBCR
40.0000 meq | EXTENDED_RELEASE_TABLET | Freq: Once | ORAL | Status: AC
  Administered 2016-08-03: 40 meq via ORAL
  Filled 2016-08-03: qty 2

## 2016-08-03 MED ORDER — FLUOXETINE HCL 20 MG PO CAPS
40.0000 mg | ORAL_CAPSULE | Freq: Every day | ORAL | Status: DC
  Administered 2016-08-04 – 2016-08-06 (×3): 40 mg via ORAL
  Filled 2016-08-03 (×3): qty 2

## 2016-08-03 MED ORDER — STERILE WATER FOR INJECTION IV SOLN
INTRAVENOUS | Status: DC
  Administered 2016-08-03: 07:00:00 via INTRAVENOUS
  Filled 2016-08-03: qty 850

## 2016-08-03 MED ORDER — MAGNESIUM HYDROXIDE 400 MG/5ML PO SUSP: 30.0000 mL | Freq: Every day | ORAL | Status: DC | PRN

## 2016-08-03 MED ORDER — NICOTINE 21 MG/24HR TD PT24
21.0000 mg | MEDICATED_PATCH | Freq: Every day | TRANSDERMAL | Status: DC
  Administered 2016-08-03: 21 mg via TRANSDERMAL
  Filled 2016-08-03: qty 1

## 2016-08-03 MED ORDER — ENOXAPARIN SODIUM 40 MG/0.4ML ~~LOC~~ SOLN
40.0000 mg | SUBCUTANEOUS | Status: DC
  Administered 2016-08-03: 40 mg via SUBCUTANEOUS
  Filled 2016-08-03: qty 0.4

## 2016-08-03 MED ORDER — FLUOXETINE HCL 20 MG PO CAPS
40.0000 mg | ORAL_CAPSULE | Freq: Every day | ORAL | Status: DC
  Filled 2016-08-03: qty 2

## 2016-08-03 MED ORDER — TRAZODONE HCL 100 MG PO TABS
100.0000 mg | ORAL_TABLET | Freq: Every evening | ORAL | Status: DC | PRN
  Filled 2016-08-03: qty 1

## 2016-08-03 MED ORDER — ONDANSETRON HCL 4 MG/2ML IJ SOLN
4.0000 mg | Freq: Four times a day (QID) | INTRAMUSCULAR | Status: DC | PRN
  Administered 2016-08-03: 4 mg via INTRAVENOUS
  Filled 2016-08-03: qty 2

## 2016-08-03 MED ORDER — LAMOTRIGINE 25 MG PO TABS
25.0000 mg | ORAL_TABLET | Freq: Every day | ORAL | Status: DC
  Administered 2016-08-04 – 2016-08-05 (×2): 25 mg via ORAL
  Filled 2016-08-03 (×2): qty 1

## 2016-08-03 MED ORDER — NICOTINE 21 MG/24HR TD PT24
21.0000 mg | MEDICATED_PATCH | Freq: Every day | TRANSDERMAL | Status: DC
  Administered 2016-08-04 – 2016-08-06 (×3): 21 mg via TRANSDERMAL
  Filled 2016-08-03 (×3): qty 1

## 2016-08-03 MED ORDER — POTASSIUM CHLORIDE CRYS ER 20 MEQ PO TBCR
40.0000 meq | EXTENDED_RELEASE_TABLET | ORAL | Status: DC
  Administered 2016-08-03: 40 meq via ORAL
  Filled 2016-08-03: qty 2

## 2016-08-03 MED ORDER — ALUM & MAG HYDROXIDE-SIMETH 200-200-20 MG/5ML PO SUSP: 30.0000 mL | ORAL | Status: DC | PRN

## 2016-08-03 MED ORDER — TRAZODONE HCL 100 MG PO TABS
100.0000 mg | ORAL_TABLET | Freq: Every evening | ORAL | Status: DC | PRN
  Administered 2016-08-03 – 2016-08-04 (×2): 100 mg via ORAL
  Filled 2016-08-03 (×4): qty 1

## 2016-08-03 MED ORDER — CHARCOAL ACTIVATED PO LIQD: 50.0000 g | Freq: Once | ORAL | Status: DC

## 2016-08-03 NOTE — ED Notes (Signed)
Poison control contacted. Onalee HuaDavid suggested to hang more fluids at 29100ml/hr and re-drawling level in 2-3 hours (0700-0800). EDP made aware.

## 2016-08-03 NOTE — Tx Team (Signed)
Initial Treatment Plan 08/03/2016 5:11 PM Briana Barryracie L Sage HQI:[id number]    PATIENT STRESSORS: Marital or family conflict Occupational concerns   PATIENT STRENGTHS: Active sense of humor Communication skills Motivation for treatment/growth   PATIENT IDENTIFIED PROBLEMS: Depression  Suicidal Ideations                   DISCHARGE CRITERIA:  Ability to meet basic life and health needs Improved stabilization in mood, thinking, and/or behavior  PRELIMINARY DISCHARGE PLAN: Outpatient therapy  PATIENT/FAMILY INVOLVEMENT: This treatment plan has been presented to and reviewed with the patient, Briana Davis, and/or family member.  The patient and family have been given the opportunity to ask questions and make suggestions.  Santo HeldNakisha D Lyle Niblett, RN 08/03/2016, 5:11 PM
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
## 2016-08-03 NOTE — ED Triage Notes (Signed)
Says took 45 aspirijn 325 mg abou 1 hr ago.  When asked if wants tokill self says "i reckon so"  Pt also drank 8-10 beers and too 2 valiums.  She is alert and oriented.  Steady gait.  Went to br and voided.

## 2016-08-03 NOTE — Discharge Instructions (Signed)
Resume diet and activity as before ° ° °

## 2016-08-03 NOTE — Consult Note (Signed)
  Psychiatry: Follow-up consult. This is a 25 year old woman with a history of mood instability and depression. Made a suicide attempt by impulsive overdose. Patient has been medically cleared. Orders are in place and plans are underway for transfer to psychiatric service.
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
## 2016-08-03 NOTE — Consult Note (Signed)
[place] Psychiatry Consult   Reason for Consult:  Consult for 25 year old woman with a history of mood instability presented to the emergency room after overdosing on aspirin Referring Physician:  [place] Patient Identification: Briana Davis MRN:  [id number] Principal Diagnosis: Bipolar 2 disorder [facility]) Diagnosis:   Patient Active Problem List   Diagnosis Date Noted  . Drug overdose [T50.901A] 08/03/2016  . Salicylate intoxication [T39.094A] 08/03/2016  . Substance induced mood disorder ([place]) [F19.94] 05/05/2016  . Involuntary commitment [Z04.6] 05/05/2016  . 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 Alcohol dependence, binge pattern ([place]) [F10.20] 01/01/2016  . Attention deficit hyperactivity disorder (ADHD) [F90.9] 01/01/2016  . Bipolar 2 disorder ([place]) [F31.81] 01/01/2016  . Polysubstance dependence ([place]) [F19.20] 12/31/2015  . Suicidal ideations [R45.851]   . Depression, major, recurrent, moderate ([place]) [F33.1] 12/15/2015  . Social anxiety disorder [F40.10] 12/15/2015  . 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 Suicidal ideation [R45.851] 12/15/2015    Total Time spent with patient: 1 hour  Subjective:   Briana Davis is a 25 y.o. female patient admitted with "I really don't know why I did it".  HPI:  Patient interviewed. Chart reviewed. Labs and vitals reviewed. Case discussed with hospitalist. Patient familiar to me from prior encounters. 24 year old woman presented to the emergency room last night after overdosing on aspirin. And tells me that she had been having an argument with her sister. This eventually led to the patient wanting to go visit her mother. As soon as she went to her mother's house, she grabbed a handful of aspirin and swallowed them. She did not do it in front of anyone but immediately told her family about it. Patient admits that there is no logic to this. She says that she was just feeling angry  and agitated. She suspects she may have simply been trying to get attention. She is not aware of having had any wish to die. Her mood has been up and down and labile but that has been chronic. The argument with her sister concerned her sister's relationship with a man whom the patient considers to be no good. Patient denies having any hallucinations or psychotic symptoms. She has not recently been compliant with any psychiatric medicine or treatment. She admits that she had been drinking yesterday approximately 7 beers. She does not really draw any connection between the drinking and the impulsive behavior.  Social history: Living with her sister. Patient is not currently working. Not in any kind of relationship. Takes care of her mother as her main activity.  Medical history: Patient does not have any significant medical problems outside of the psychiatric.  Substance abuse history: Patient has a history of binge pattern alcohol abuse. She reports that she drinks several times a week often 7 or 8 beers at a time. She minimizes the degree to which it is a problem although she acknowledges that she has tried to stop drinking in the past. She denies that she is currently abusing any other substances. Denies any history of DTs or seizures with withdrawal.  Past Psychiatric History: Patient has a history of mood lability. Variously diagnosed as depression or bipolar disorder. At times also diagnosed as having attention deficit disorder. She has been a client following up at [facility] but has not recently been compliant. She does have previous hospital treatment and overdoses in the past.  Risk to Self: Is patient at risk for suicide?: Yes Risk to Others:   Prior  Inpatient Therapy:   Prior Outpatient Therapy:    Past Medical History:  Past Medical History:  Diagnosis Date  . Aggression   . Alcohol consumption binge drinking   . Depression   . Diverticulitis   . Kidney stone   . Suicidal ideation      Past Surgical History:  Procedure Laterality Date  . TONSILLECTOMY    . WISDOM TOOTH EXTRACTION     Family History:  Family History  Problem Relation Age of Onset  . Heart disease Mother   . Heart disease Father    Family Psychiatric  History: Patient believes that her mother has some mood instability problems but otherwise knows of no family history Social History:  History  Alcohol Use  . Yes    Comment: Sober for 20 days     History  Drug Use No    Social History   Social History  . Marital status: Single    Spouse name: N/A  . Number of children: N/A  . Years of education: N/A   Occupational History  . unemployed    Social History Main Topics  . Smoking status: Current Every Day Smoker    Packs/day: 1.00    Types: Cigarettes  . Smokeless tobacco: Never Used  . Alcohol use Yes     Comment: Sober for 20 days  . Drug use: No  . Sexual activity: Not Currently   Other Topics Concern  . None   Social History Narrative  . None   Additional Social History:    Allergies:   Allergies  Allergen Reactions  . Latex Other (See Comments)    Only in vaginal area  . Amoxicillin Rash    Has patient had a PCN reaction causing immediate rash, facial/tongue/throat swelling, SOB or lightheadedness with hypotension: Yes Has patient had a PCN reaction causing severe rash involving mucus membranes or skin necrosis: Yes Has patient had a PCN reaction that required hospitalization Yes Has patient had a PCN reaction occurring within the last 10 years: No If all of the above answers are "NO", then may proceed with Cephalosporin use.     Labs:  Results for orders placed or performed during the hospital encounter of 08/03/16 (from the past 48 hour(s))  CBC     Status: Abnormal   Collection Time: 08/03/16 12:26 AM  Result Value Ref Range   WBC 14.0 (H) 3.6 - 11.0 K/uL   RBC 4.15 3.80 - 5.20 MIL/uL   Hemoglobin 13.4 12.0 - 16.0 g/dL   HCT 38.6 35.0 - 47.0 %   MCV 93.1  80.0 - 100.0 fL   MCH 32.3 26.0 - 34.0 pg   MCHC 34.7 32.0 - 36.0 g/dL   RDW 13.8 11.5 - 14.5 %   Platelets 472 (H) 150 - 440 K/uL  Comprehensive metabolic panel     Status: Abnormal   Collection Time: 08/03/16 12:26 AM  Result Value Ref Range   Sodium 141 135 - 145 mmol/L   Potassium 3.3 (L) 3.5 - 5.1 mmol/L   Chloride 109 101 - 111 mmol/L   CO2 22 22 - 32 mmol/L   Glucose, Bld 90 65 - 99 mg/dL   BUN 11 6 - 20 mg/dL   Creatinine, Ser 0.42 (L) 0.44 - 1.00 mg/dL   Calcium 9.4 8.9 - 10.3 mg/dL   Total Protein 7.8 6.5 - 8.1 g/dL   Albumin 4.3 3.5 - 5.0 g/dL   AST 17 15 - 41 U/L   ALT 15  14 - 54 U/L   Alkaline Phosphatase 60 38 - 126 U/L   Total Bilirubin 0.2 (L) 0.3 - 1.2 mg/dL   GFR calc non Af Amer >60 >60 mL/min   GFR calc Af Amer >60 >60 mL/min    Comment: (NOTE) The eGFR has been calculated using the CKD EPI equation. This calculation has not been validated in all clinical situations. eGFR's persistently <60 mL/min signify possible Chronic Kidney Disease.    Anion gap 10 5 - 15  Ethanol     Status: Abnormal   Collection Time: 08/03/16 12:26 AM  Result Value Ref Range   Alcohol, Ethyl (B) 158 (H) <5 mg/dL    Comment:        LOWEST DETECTABLE LIMIT FOR SERUM ALCOHOL IS 5 mg/dL FOR MEDICAL PURPOSES ONLY   Acetaminophen level     Status: Abnormal   Collection Time: 08/03/16 12:26 AM  Result Value Ref Range   Acetaminophen (Tylenol), Serum <10 (L) 10 - 30 ug/mL    Comment:        THERAPEUTIC CONCENTRATIONS VARY SIGNIFICANTLY. A RANGE OF 10-30 ug/mL MAY BE AN EFFECTIVE CONCENTRATION FOR MANY PATIENTS. HOWEVER, SOME ARE BEST TREATED AT CONCENTRATIONS OUTSIDE THIS RANGE. ACETAMINOPHEN CONCENTRATIONS >150 ug/mL AT 4 HOURS AFTER INGESTION AND >50 ug/mL AT 12 HOURS AFTER INGESTION ARE OFTEN ASSOCIATED WITH TOXIC REACTIONS.   Salicylate level     Status: None   Collection Time: 08/03/16 12:26 AM  Result Value Ref Range   Salicylate Lvl 45.6 2.8 - 30.0 mg/dL   Magnesium     Status: None   Collection Time: 08/03/16 12:26 AM  Result Value Ref Range   Magnesium 2.3 1.7 - 2.4 mg/dL  Urine Drug Screen, Qualitative ([facility] only)     Status: Abnormal   Collection Time: 08/03/16 12:27 AM  Result Value Ref Range   Tricyclic, Ur Screen NONE DETECTED NONE DETECTED   Amphetamines, Ur Screen NONE DETECTED NONE DETECTED   MDMA (Ecstasy)Ur Screen NONE DETECTED NONE DETECTED   Cocaine Metabolite,Ur [place] NONE DETECTED NONE DETECTED   Opiate, Ur Screen NONE DETECTED NONE DETECTED   Phencyclidine (PCP) Ur S NONE DETECTED NONE DETECTED   Cannabinoid 50 Ng, Ur [place] NONE DETECTED NONE DETECTED   Barbiturates, Ur Screen NONE DETECTED NONE DETECTED   Benzodiazepine, Ur Scrn POSITIVE (A) NONE DETECTED   Methadone Scn, Ur NONE DETECTED NONE DETECTED    Comment: (NOTE) 256  Tricyclics, urine               Cutoff 1000 ng/mL 200  Amphetamines, urine             Cutoff 1000 ng/mL 300  MDMA (Ecstasy), urine           Cutoff 500 ng/mL 400  Cocaine Metabolite, urine       Cutoff 300 ng/mL 500  Opiate, urine                   Cutoff 300 ng/mL 600  Phencyclidine (PCP), urine      Cutoff 25 ng/mL 700  Cannabinoid, urine              Cutoff 50 ng/mL 800  Barbiturates, urine             Cutoff 200 ng/mL 900  Benzodiazepine, urine           Cutoff 200 ng/mL 1000 Methadone, urine                Cutoff  300 ng/mL 1100 1200 The urine drug screen provides only a preliminary, unconfirmed 1300 analytical test result and should not be used for non-medical 1400 purposes. Clinical consideration and professional judgment should 1500 be applied to any positive drug screen result due to possible 1600 interfering substances. A more specific alternate chemical method 1700 must be used in order to obtain a confirmed analytical result.  1800 Gas chromato graphy / mass spectrometry (GC/MS) is the preferred 1900 confirmatory method.   Urinalysis complete, with microscopic ([facility] only)      Status: Abnormal   Collection Time: 08/03/16 12:27 AM  Result Value Ref Range   Color, Urine COLORLESS (A) YELLOW   APPearance CLEAR (A) CLEAR   Glucose, UA NEGATIVE NEGATIVE mg/dL   Bilirubin Urine NEGATIVE NEGATIVE   Ketones, ur NEGATIVE NEGATIVE mg/dL   Specific Gravity, Urine 1.012 1.005 - 1.030   Hgb urine dipstick 1+ (A) NEGATIVE   pH 5.0 5.0 - 8.0   Protein, ur NEGATIVE NEGATIVE mg/dL   Nitrite NEGATIVE NEGATIVE   Leukocytes, UA NEGATIVE NEGATIVE   RBC / HPF 0-5 0 - 5 RBC/hpf   WBC, UA 0-5 0 - 5 WBC/hpf   Bacteria, UA RARE (A) NONE SEEN   Squamous Epithelial / LPF 0-5 (A) NONE SEEN  Pregnancy, urine POC     Status: None   Collection Time: 08/03/16 12:46 AM  Result Value Ref Range   Preg Test, Ur NEGATIVE NEGATIVE    Comment:        THE SENSITIVITY OF THIS METHODOLOGY IS >01 mIU/mL   Salicylate level     Status: Abnormal   Collection Time: 08/03/16  4:55 AM  Result Value Ref Range   Salicylate Lvl 60.1 (HH) 2.8 - 30.0 mg/dL    Comment: CRITICAL RESULT CALLED TO, READ BACK BY AND VERIFIED WITH KAILEY WALKER AT 0932 ON 08/03/16.Marland KitchenMarland KitchenCharles A. Cannon, Jr. Memorial Hospital   Salicylate level     Status: None   Collection Time: 08/03/16  6:46 AM  Result Value Ref Range   Salicylate Lvl 35.5 2.8 - 30.0 mg/dL  Glucose, capillary     Status: Abnormal   Collection Time: 08/03/16  8:17 AM  Result Value Ref Range   Glucose-Capillary 104 (H) 65 - 99 mg/dL  Basic metabolic panel     Status: Abnormal   Collection Time: 08/03/16 12:14 PM  Result Value Ref Range   Sodium 141 135 - 145 mmol/L   Potassium 3.1 (L) 3.5 - 5.1 mmol/L   Chloride 107 101 - 111 mmol/L   CO2 28 22 - 32 mmol/L   Glucose, Bld 95 65 - 99 mg/dL   BUN 9 6 - 20 mg/dL   Creatinine, Ser 0.43 (L) 0.44 - 1.00 mg/dL   Calcium 8.1 (L) 8.9 - 10.3 mg/dL   GFR calc non Af Amer >60 >60 mL/min   GFR calc Af Amer >60 >60 mL/min    Comment: (NOTE) The eGFR has been calculated using the CKD EPI equation. This calculation has not been validated in all  clinical situations. eGFR's persistently <60 mL/min signify possible Chronic Kidney Disease.    Anion gap 6 5 - 15  Salicylate level     Status: None   Collection Time: 08/03/16 12:14 PM  Result Value Ref Range   Salicylate Lvl 8.9 2.8 - 30.0 mg/dL    Current Facility-Administered Medications  Medication Dose Route Frequency Provider Last Rate Last Dose  . acetaminophen (TYLENOL) tablet 650 mg  650 mg Oral Q6H PRN Saundra Shelling, MD  Or  . acetaminophen (TYLENOL) suppository 650 mg  650 mg Rectal Q6H PRN Pavan Pyreddy, MD      . charcoal activated (NO SORBITOL) (ACTIDOSE-AQUA) suspension 50 g  50 g Oral Once Gregor Hams, MD      . enoxaparin (LOVENOX) injection 40 mg  40 mg Subcutaneous Q24H Saundra Shelling, MD   40 mg at 08/03/16 0959  . FLUoxetine (PROZAC) capsule 40 mg  40 mg Oral Daily Saundra Shelling, MD   Stopped at 08/03/16 0957  . lamoTRIgine (LAMICTAL) tablet 25 mg  25 mg Oral Daily Pavan Pyreddy, MD   25 mg at 08/03/16 0957  . nicotine (NICODERM CQ - dosed in mg/24 hours) patch 21 mg  21 mg Transdermal Daily Saundra Shelling, MD   21 mg at 08/03/16 0957  . ondansetron (ZOFRAN) tablet 4 mg  4 mg Oral Q6H PRN Saundra Shelling, MD       Or  . ondansetron (ZOFRAN) injection 4 mg  4 mg Intravenous Q6H PRN Saundra Shelling, MD   4 mg at 08/03/16 0743  . potassium chloride SA (K-DUR,KLOR-CON) CR tablet 40 mEq  40 mEq Oral Q4H Srikar Sudini, MD   40 mEq at 08/03/16 1353  . senna-docusate (Senokot-S) tablet 1 tablet  1 tablet Oral QHS PRN Pavan Pyreddy, MD      . sodium chloride flush (NS) 0.9 % injection 3 mL  3 mL Intravenous Q12H Pavan Pyreddy, MD   3 mL at 08/03/16 1000  . traZODone (DESYREL) tablet 100 mg  100 mg Oral QHS PRN Saundra Shelling, MD        Musculoskeletal: Strength & Muscle Tone: within normal limits Gait & Station: normal Patient leans: N/A  Psychiatric Specialty Exam: Physical Exam  Nursing note and vitals reviewed. Constitutional: She appears well-developed and  well-nourished.  HENT:  Head: Normocephalic and atraumatic.  Eyes: Conjunctivae are normal. Pupils are equal, round, and reactive to light.  Neck: Normal range of motion.  Cardiovascular: Regular rhythm and normal heart sounds.   Respiratory: Effort normal. No respiratory distress.  GI: Soft.  Musculoskeletal: Normal range of motion.  Neurological: She is alert.  Skin: Skin is warm and dry.  Psychiatric: Her speech is normal and behavior is normal. Her affect is blunt. Cognition and memory are normal. She expresses impulsivity. She expresses suicidal ideation. She expresses no suicidal plans.    Review of Systems  Constitutional: Negative.   HENT: Negative.   Eyes: Negative.   Respiratory: Negative.   Cardiovascular: Negative.   Gastrointestinal: Negative.   Musculoskeletal: Negative.   Skin: Negative.   Neurological: Negative.   Psychiatric/Behavioral: Positive for depression, memory loss and substance abuse. Negative for hallucinations and suicidal ideas. The patient is nervous/anxious and has insomnia.     Blood pressure 123/89, pulse 97, temperature 97.8 F (36.6 C), temperature source Oral, resp. rate (!) 21, height 5' 5"  (1.651 m), weight 67.2 kg (148 lb 2.4 oz), last menstrual period 07/20/2016, SpO2 98 %.Body mass index is 24.65 kg/m.  General Appearance: Casual  Eye Contact:  Fair  Speech:  Clear and Coherent  Volume:  Decreased  Mood:  Anxious  Affect:  Congruent  Thought Process:  Goal Directed  Orientation:  Full (Time, Place, and Person)  Thought Content:  Logical  Suicidal Thoughts:  No  Homicidal Thoughts:  No  Memory:  Immediate;   Good Recent;   Fair Remote;   Fair  Judgement:  Fair  Insight:  Fair  Psychomotor Activity:  Decreased  Concentration:  Concentration: Fair  Recall:  [employer] of Knowledge:  Fair  Language:  Fair  Akathisia:  No  Handed:  Right  AIMS (if indicated):     Assets:  Communication Skills Desire for  Improvement Housing Physical Health Resilience  ADL's:  Intact  Cognition:  WNL  Sleep:        Treatment Plan Summary: Daily contact with patient to assess and evaluate symptoms and progress in treatment, Medication management and Plan This is a 25 year old woman who overdosed on aspirin. Salicylate level rose just barely into the toxic range and now has calmed down. Medically stable. Spoke with hospitalist. Patient is considered medically stable and does not require further treatment on the internal medicine service. Patient is agreeable to restarting lamotrigine and Prozac. Reviewed case with TTS. Patient can be admitted to psychiatric service downstairs. Continue IVC.  Disposition: Recommend psychiatric Inpatient admission when medically cleared. Supportive therapy provided about ongoing stressors.  Alethia Berthold, MD 08/03/2016 2:40 PM

## 2016-08-03 NOTE — ED Notes (Signed)
Pt assisted to bathroom. Ambulated without difficulty.

## 2016-08-03 NOTE — Discharge Summary (Signed)
[facility] [place] [facility]   PATIENT NAME: Briana Davis Paluch    MR#:  [id number]  DATE OF BIRTH:  05/22/1991  DATE OF ADMISSION:  08/03/2016 ADMITTING PHYSICIAN: Ihor AustinPavan Pyreddy, MD  DATE OF DISCHARGE: No discharge date for patient encounter.  PRIMARY CARE PHYSICIAN: No PCP Per Patient   ADMISSION DIAGNOSIS:  Alcohol intoxication, uncomplicated (HCC) [F10.120] Overdose, intentional self-harm, initial encounter (HCC) [T50.902A]  DISCHARGE DIAGNOSIS:  Principal Problem:   Drug overdose Active Problems:   Salicylate intoxication   SECONDARY DIAGNOSIS:   Past Medical History:  Diagnosis Date  . Aggression   . Alcohol consumption binge drinking   . Depression   . Diverticulitis   . Kidney stone   . Suicidal ideation      ADMITTING HISTORY  HISTORY OF PRESENT ILLNESS: Briana Davis Dorian  is a 25 y.o. female with a known history of Alcohol abuse, depression, suicidal ideation, nephrolithiasis, diverticular disease presented to the emergency room after she ingested 45 tablets of aspirin. Patient was feeling depressed and had an argument with her sister. Hence she consumed 45 tablets of aspirin to kill herself. Patient has been involuntarily committed in the emergency room. Patient was given IV sodium bicarbonate and was started on IV sodium bicarbonate drip. Her salicylate level was checked and was found to be elevated. No complaints of any tinnitus or ringing sensation in the ears. No complaints of tingling or numbness in any part of the body. No complaints of chest pain. No history of any shortness of breath. Hospitalist service was consulted for further care of the patient.  HOSPITAL COURSE:   * Severe depression * Attempted suicide * Salicylate poisoning  * hypokalemia  Patient was admitted to stepdown unit on a bicarbonate drip due to elevated salicylate level to 33.4.  Patient initially had treatment with charcoal in the emergency room.   patient  was involuntarily committed. Had a sitter at bedside through the hospital stay. Was seen by psychiatry Dr. Toni Amendlapacs. Discussed case with him and patient will be transferred to behavioral health unit. Repeat salicylate level at 8.4.  Patient is medically stable for transfer to behavioral health unit.  CONSULTS OBTAINED:  Treatment Team:  Audery AmelJohn T Clapacs, MD  DRUG ALLERGIES:   Allergies  Allergen Reactions  . Latex Other (See Comments)    Only in vaginal area  . Amoxicillin Rash    Has patient had a PCN reaction causing immediate rash, facial/tongue/throat swelling, SOB or lightheadedness with hypotension: Yes Has patient had a PCN reaction causing severe rash involving mucus membranes or skin necrosis: Yes Has patient had a PCN reaction that required hospitalization Yes Has patient had a PCN reaction occurring within the last 10 years: No If all of the above answers are "NO", then may proceed with Cephalosporin use.     DISCHARGE MEDICATIONS:   Current Discharge Medication List    CONTINUE these medications which have NOT CHANGED   Details  amphetamine-dextroamphetamine (ADDERALL XR) 10 MG 24 hr capsule Take 1 capsule (10 mg total) by mouth 2 (two) times daily. For ADHD Qty: 16 capsule, Refills: 0    FLUoxetine (PROZAC) 40 MG capsule Take 1 capsule (40 mg total) by mouth daily. For depression Qty: 30 capsule, Refills: 0    hydrOXYzine (ATARAX/VISTARIL) 25 MG tablet Take 1 tablet (25 mg total) by mouth every 6 (six) hours as needed for anxiety. Qty: 60 tablet, Refills: 0    lamoTRIgine (LAMICTAL) 25 MG tablet Take 1 tablet (25 mg  total) by mouth daily. For mood stabilization Qty: 30 tablet, Refills: 0    traZODone (DESYREL) 100 MG tablet Take 1 tablet (100 mg total) by mouth at bedtime as needed for sleep (may repeat x 1 in 1 hour if needed). Qty: 60 tablet, Refills: 0      STOP taking these medications     nicotine (NICODERM CQ - DOSED IN MG/24 HOURS) 21 mg/24hr patch          Today   VITAL SIGNS:  Blood pressure 123/89, pulse 97, temperature 98.9 F (37.2 C), temperature source Oral, resp. rate (!) 21, height 5\' 5"  (1.651 m), weight 67.2 kg (148 lb 2.4 oz), last menstrual period 07/20/2016, SpO2 98 %.  I/O:   Intake/Output Summary (Last 24 hours) at 08/03/16 1419 Last data filed at 08/03/16 1355  Gross per 24 hour  Intake           425.17 ml  Output                0 ml  Net           425.17 ml    PHYSICAL EXAMINATION:  Physical Exam  GENERAL:  25 y.o.-year-old patient lying in the bed with no acute distress.  LUNGS: Normal breath sounds bilaterally, no wheezing, rales,rhonchi or crepitation. No use of accessory muscles of respiration.  CARDIOVASCULAR: S1, S2 normal. No murmurs, rubs, or gallops.  ABDOMEN: Soft, non-tender, non-distended. Bowel sounds present. No organomegaly or mass.  NEUROLOGIC: Moves all 4 extremities. PSYCHIATRIC: The patient is alert and awake. Pleasant. SKIN: No obvious rash, lesion, or ulcer.   DATA REVIEW:   CBC  Recent Labs Lab 08/03/16 0026  WBC 14.0*  HGB 13.4  HCT 38.6  PLT 472*    Chemistries   Recent Labs Lab 08/03/16 0026 08/03/16 1214  NA 141 141  K 3.3* 3.1*  CL 109 107  CO2 22 28  GLUCOSE 90 95  BUN 11 9  CREATININE 0.42* 0.43*  CALCIUM 9.4 8.1*  MG 2.3  --   AST 17  --   ALT 15  --   ALKPHOS 60  --   BILITOT 0.2*  --     Cardiac Enzymes No results for input(s): TROPONINI in the last 168 hours.  Microbiology Results  Results for orders placed or performed in visit on 07/29/14  Wet prep, genital     Status: None   Collection Time: 07/29/14  9:52 PM  Result Value Ref Range Status   Micro Text Report   Final       COMMENT                   MANY WHITE BLOOD CELLS SEEN   COMMENT                   CLUE CELLS SEEN   COMMENT                   TRICHOMONAS SEEN   COMMENT                   NO SPERMATOZOA SEEN   COMMENT                   NO YEAST SEEN   ANTIBIOTIC  GC/Chlamydia Probe Amp     Status: None   Collection Time: 07/29/14  9:52 PM  Result Value Ref Range Status   Micro Text Report   Final       CHLAMYDIA                 CHLAMYDIA TRACHOMATIS NEGATIVE   N.GONORRHOEAE             N.GONORRHOEAE NEGATIVE   ANTIBIOTIC                                                        RADIOLOGY:  No results found.  Follow up with PCP in 1 week.  Management plans discussed with the patient, family and they are in agreement.  CODE STATUS:     Code Status Orders        Start     Ordered   08/03/16 0815  Full code  Continuous     08/03/16 0814    Code Status History    Date Active Date Inactive Code Status Order ID Comments User Context   12/31/2015  3:53 PM 01/05/2016 10:09 PM Full Code [id number]  Charm Rings, NP Inpatient      TOTAL TIME TAKING CARE OF THIS PATIENT ON DAY OF DISCHARGE: more than 30 minutes.   Milagros Loll R M.D on 08/03/2016 at 2:19 PM  Between 7am to 6pm - Pager - [phone]  After 6pm go to [email or web address] - password [email or web address] [facility]  [place] [facility]  Office  [phone]  CC: Primary care physician; No PCP Per Patient  Note: This dictation was prepared with Dragon dictation along with smaller phrase technology. Any transcriptional errors that result from this process are unintentional.

## 2016-08-03 NOTE — ED Provider Notes (Signed)
[facility] Emergency Department Provider Note  ____________________________________________   First MD Initiated Contact with Patient 08/03/16 0020     (approximate)  I have reviewed the triage vital signs and the nursing notes.   HISTORY  Chief Complaint Drug Overdose   HPI Briana Davis is a 25 y.o. female with history of depression and suicidal ideation alcohol binge drinking presents via EMS with history of taking approximately 45 aspirin 325 mg tablets and 2 Valiums approximately one hour ago in an attempt to "kill" herself. Patient also states that she drank greater than 12 beers.    Past Medical History:  Diagnosis Date  . Aggression   . Alcohol consumption binge drinking   . Depression   . Diverticulitis   . Kidney stone   . Suicidal ideation     Patient Active Problem List   Diagnosis Date Noted  . Substance induced mood disorder (HCC) 05/05/2016  . Involuntary commitment 05/05/2016  . Attention deficit hyperactivity disorder (ADHD), combined type   . Alcohol dependence, binge pattern (HCC) 01/01/2016  . Attention deficit hyperactivity disorder (ADHD) 01/01/2016  . Bipolar 2 disorder (HCC) 01/01/2016  . Polysubstance dependence (HCC) 12/31/2015  . Suicidal ideations   . Depression, major, recurrent, moderate (HCC) 12/15/2015  . Social anxiety disorder 12/15/2015  . Mild alcohol abuse in early remission 12/15/2015  . Suicidal ideation 12/15/2015    Past Surgical History:  Procedure Laterality Date  . TONSILLECTOMY    . WISDOM TOOTH EXTRACTION      Prior to Admission medications   Medication Sig Start Date End Date Taking? Authorizing Provider  amphetamine-dextroamphetamine (ADDERALL XR) 10 MG 24 hr capsule Take 1 capsule (10 mg total) by mouth 2 (two) times daily. For ADHD 01/05/16   Sanjuana KavaAgnes I Nwoko, NP  ciprofloxacin (CIPRO) 500 MG tablet One po bid 01/12/16   Bethann BerkshireJoseph Zammit, MD  FLUoxetine (PROZAC) 40 MG capsule Take 1 capsule  (40 mg total) by mouth daily. For depression 01/05/16   Sanjuana KavaAgnes I Nwoko, NP  hydrOXYzine (ATARAX/VISTARIL) 25 MG tablet Take 1 tablet (25 mg total) by mouth every 6 (six) hours as needed for anxiety. 01/05/16   Sanjuana KavaAgnes I Nwoko, NP  lamoTRIgine (LAMICTAL) 25 MG tablet Take 1 tablet (25 mg total) by mouth daily. For mood stabilization 01/05/16   Sanjuana KavaAgnes I Nwoko, NP  metroNIDAZOLE (FLAGYL) 500 MG tablet Take 1 tablet (500 mg total) by mouth 4 (four) times daily. 01/12/16   Bethann BerkshireJoseph Zammit, MD  naproxen (NAPROSYN) 500 MG tablet Take 1 tablet (500 mg total) by mouth 2 (two) times daily with a meal. 02/12/16   Sharman CheekPhillip Stafford, MD  nicotine (NICODERM CQ - DOSED IN MG/24 HOURS) 21 mg/24hr patch Place 1 patch (21 mg total) onto the skin daily. For smoking cessation Patient not taking: Reported on 01/12/2016 01/05/16   Sanjuana KavaAgnes I Nwoko, NP  ondansetron (ZOFRAN ODT) 4 MG disintegrating tablet Take 1 tablet (4 mg total) by mouth every 8 (eight) hours as needed for nausea or vomiting. 02/12/16   Sharman CheekPhillip Stafford, MD  oxyCODONE-acetaminophen (ROXICET) 5-325 MG tablet Take 1 tablet by mouth every 6 (six) hours as needed for severe pain. 02/12/16   Sharman CheekPhillip Stafford, MD  traZODone (DESYREL) 100 MG tablet Take 1 tablet (100 mg total) by mouth at bedtime as needed for sleep (may repeat x 1 in 1 hour if needed). 01/05/16   Sanjuana KavaAgnes I Nwoko, NP    Allergies Latex and Amoxicillin  No family history on file.  Social  History Social History  Substance Use Topics  . Smoking status: Current Every Day Smoker    Packs/day: 1.00    Types: Cigarettes  . Smokeless tobacco: Never Used  . Alcohol use Yes     Comment: Sober for 20 days    Review of Systems Constitutional: No fever/chills Eyes: No visual changes. ENT: No sore throat. Cardiovascular: Denies chest pain. Respiratory: Denies shortness of breath. Gastrointestinal: No abdominal pain.  No nausea, no vomiting.  No diarrhea.  No constipation. Genitourinary: Negative for  dysuria. Musculoskeletal: Negative for back pain. Skin: Negative for rash. Neurological: Negative for headaches, focal weakness or numbness.  10-point ROS otherwise negative.  ____________________________________________   PHYSICAL EXAM:  VITAL SIGNS: ED Triage Vitals  Enc Vitals Group     BP 08/03/16 0021 115/76     Pulse Rate 08/03/16 0011 (!) 127     Resp 08/03/16 0011 18     Temp 08/03/16 0028 97.9 F (36.6 C)     Temp Source 08/03/16 0028 Oral     SpO2 08/03/16 0011 95 %     Weight 08/03/16 0016 147 lb (66.7 kg)     Height 08/03/16 0016 5\' 5"  (1.651 m)     Head Circumference --      Peak Flow --      Pain Score 08/03/16 0016 6     Pain Loc --      Pain Edu? --      Excl. in GC? --     Constitutional: Alert and oriented. Well appearing and in no acute distress. Eyes: Conjunctivae are normal. PERRL. EOMI. Head: Atraumatic. Nose: No congestion/rhinnorhea. Mouth/Throat: Mucous membranes are moist.  Oropharynx non-erythematous. Neck: No stridor.  No meningeal signs.  No cervical spine tenderness to palpation. Cardiovascular: Normal rate, regular rhythm. Good peripheral circulation. Grossly normal heart sounds. Respiratory: Normal respiratory effort.  No retractions. Lungs CTAB. Gastrointestinal: Soft and nontender. No distention.  Musculoskeletal: No lower extremity tenderness nor edema. No gross deformities of extremities. Neurologic:  Normal speech and language. No gross focal neurologic deficits are appreciated.  Skin:  Skin is warm, dry and intact. No rash noted. Psychiatric: Mood and affect are normal. Speech and behavior are normal  ____________________________________________   LABS (all labs ordered are listed, but only abnormal results are displayed)  Labs Reviewed  CBC - Abnormal; Notable for the following:       Result Value   WBC 14.0 (*)    Platelets 472 (*)    All other components within normal limits  COMPREHENSIVE METABOLIC PANEL - Abnormal;  Notable for the following:    Potassium 3.3 (*)    Creatinine, Ser 0.42 (*)    Total Bilirubin 0.2 (*)    All other components within normal limits  URINE DRUG SCREEN, QUALITATIVE ([facility] ONLY) - Abnormal; Notable for the following:    Benzodiazepine, Ur Scrn POSITIVE (*)    All other components within normal limits  ETHANOL - Abnormal; Notable for the following:    Alcohol, Ethyl (B) 158 (*)    All other components within normal limits  ACETAMINOPHEN LEVEL - Abnormal; Notable for the following:    Acetaminophen (Tylenol), Serum <10 (*)    All other components within normal limits  SALICYLATE LEVEL  POCT PREGNANCY, URINE   ____________________________________________  EKG  ED ECG REPORT I, [place] Tyrena Gohr, the attending physician, personally viewed and interpreted this ECG.   Date: 08/03/2016  EKG Time: 12:14 AM  Rate: 123  Rhythm: Sinus tachycardia  Axis: Normal  Intervals: Normal  ST&T Change: None     Procedures  Critical care:CRITICAL CARE Performed by: Darci Current   Total critical care time: 45 minutes  Critical care time was exclusive of separately billable procedures and treating other patients.  Critical care was necessary to treat or prevent imminent or life-threatening deterioration.  Critical care was time spent personally by me on the following activities: development of treatment plan with patient and/or surrogate as well as nursing, discussions with consultants, evaluation of patient's response to treatment, examination of patient, obtaining history from patient or surrogate, ordering and performing treatments and interventions, ordering and review of laboratory studies, ordering and review of radiographic studies, pulse oximetry and re-evaluation of patient's condition.    INITIAL IMPRESSION / ASSESSMENT AND PLAN / ED COURSE  Pertinent labs & imaging results that were available during my care of the patient were reviewed by me and considered in  my medical decision making (see chart for details).  Given history physical exam patient given activated charcoal 50 g. patient initial salicylate level 28.2 however repeat salicylate level was 33.4. Patient had received 2 L IV fluids bolus before the repeat salicylate level was performed. Patient discussed with Dr. Willadean Carol for hospital admission for salicylate overdose. Patient will receive 2 Amp sodium bicarbonate as well as sodium bicarbonate infusion   Clinical Course    ____________________________________________  FINAL CLINICAL IMPRESSION(S) / ED DIAGNOSES  Final diagnoses:  Overdose, intentional self-harm, initial encounter (HCC)  Alcohol intoxication, uncomplicated (HCC)     MEDICATIONS GIVEN DURING THIS VISIT:  Medications  charcoal activated (NO SORBITOL) (ACTIDOSE-AQUA) suspension 50 g (50 g Oral Not Given 08/03/16 0132)  sodium chloride 0.9 % bolus 1,000 mL (not administered)  sodium chloride 0.9 % bolus 1,000 mL (not administered)     NEW OUTPATIENT MEDICATIONS STARTED DURING THIS VISIT:  New Prescriptions   No medications on file      Note:  This document was prepared using Dragon voice recognition software and may include unintentional dictation errors.    Darci Current, MD 08/03/16 [phone]

## 2016-08-03 NOTE — H&P (Signed)
[facility] [place] [facility]   PATIENT NAME: Briana Davis    MR#:  [id number]  DATE OF BIRTH:  01/23/1991  DATE OF ADMISSION:  08/03/2016  PRIMARY CARE PHYSICIAN: No PCP Per Patient   REQUESTING/REFERRING PHYSICIAN:   CHIEF COMPLAINT:   Chief Complaint  Patient presents with  . Drug Overdose    HISTORY OF PRESENT ILLNESS: Briana Davis  is a 25 y.o. female with a known history of Alcohol abuse, depression, suicidal ideation, nephrolithiasis, diverticular disease presented to the emergency room after she ingested 45 tablets of aspirin. Patient was feeling depressed and had an argument with her sister. Hence she consumed 45 tablets of aspirin to kill herself. Patient has been involuntarily committed in the emergency room. Patient was given IV sodium bicarbonate and was started on IV sodium bicarbonate drip. Her salicylate level was checked and was found to be elevated. No complaints of any tinnitus or ringing sensation in the ears. No complaints of tingling or numbness in any part of the body. No complaints of chest pain. No history of any shortness of breath. Hospitalist service was consulted for further care of the patient.  PAST MEDICAL HISTORY:   Past Medical History:  Diagnosis Date  . Aggression   . Alcohol consumption binge drinking   . Depression   . Diverticulitis   . Kidney stone   . Suicidal ideation     PAST SURGICAL HISTORY: Past Surgical History:  Procedure Laterality Date  . TONSILLECTOMY    . WISDOM TOOTH EXTRACTION      SOCIAL HISTORY:  Social History  Substance Use Topics  . Smoking status: Current Every Day Smoker    Packs/day: 1.00    Types: Cigarettes  . Smokeless tobacco: Never Used  . Alcohol use Yes     Comment: Sober for 20 days    FAMILY HISTORY:  Family History  Problem Relation Age of Onset  . Heart disease Mother   . Heart disease Father     DRUG ALLERGIES:  Allergies  Allergen Reactions  . Latex  Other (See Comments)    Only in vaginal area  . Amoxicillin Rash    Has patient had a PCN reaction causing immediate rash, facial/tongue/throat swelling, SOB or lightheadedness with hypotension: Yes Has patient had a PCN reaction causing severe rash involving mucus membranes or skin necrosis: Yes Has patient had a PCN reaction that required hospitalization Yes Has patient had a PCN reaction occurring within the last 10 years: No If all of the above answers are "NO", then may proceed with Cephalosporin use.     REVIEW OF SYSTEMS:   CONSTITUTIONAL: No fever, has weakness.  EYES: No blurred or double vision.  EARS, NOSE, AND THROAT: No tinnitus or ear pain.  RESPIRATORY: No cough, shortness of breath, wheezing or hemoptysis.  CARDIOVASCULAR: No chest pain, orthopnea, edema.  GASTROINTESTINAL: No nausea, vomiting, diarrhea or abdominal pain.  GENITOURINARY: No dysuria, hematuria.  ENDOCRINE: No polyuria, nocturia,  HEMATOLOGY: No anemia, easy bruising or bleeding SKIN: No rash or lesion. MUSCULOSKELETAL: No joint pain or arthritis.   NEUROLOGIC: No tingling, numbness, weakness.  PSYCHIATRY: has depression  MEDICATIONS AT HOME:  Prior to Admission medications   Medication Sig Start Date End Date Taking? Authorizing Provider  amphetamine-dextroamphetamine (ADDERALL XR) 10 MG 24 hr capsule Take 1 capsule (10 mg total) by mouth 2 (two) times daily. For ADHD 01/05/16   Sanjuana Kava, NP  ciprofloxacin (CIPRO) 500 MG tablet One po bid  01/12/16   Bethann BerkshireJoseph Zammit, MD  FLUoxetine (PROZAC) 40 MG capsule Take 1 capsule (40 mg total) by mouth daily. For depression 01/05/16   Sanjuana KavaAgnes I Nwoko, NP  hydrOXYzine (ATARAX/VISTARIL) 25 MG tablet Take 1 tablet (25 mg total) by mouth every 6 (six) hours as needed for anxiety. 01/05/16   Sanjuana KavaAgnes I Nwoko, NP  lamoTRIgine (LAMICTAL) 25 MG tablet Take 1 tablet (25 mg total) by mouth daily. For mood stabilization 01/05/16   Sanjuana KavaAgnes I Nwoko, NP  metroNIDAZOLE (FLAGYL) 500 MG  tablet Take 1 tablet (500 mg total) by mouth 4 (four) times daily. 01/12/16   Bethann BerkshireJoseph Zammit, MD  naproxen (NAPROSYN) 500 MG tablet Take 1 tablet (500 mg total) by mouth 2 (two) times daily with a meal. 02/12/16   Sharman CheekPhillip Stafford, MD  nicotine (NICODERM CQ - DOSED IN MG/24 HOURS) 21 mg/24hr patch Place 1 patch (21 mg total) onto the skin daily. For smoking cessation Patient not taking: Reported on 01/12/2016 01/05/16   Sanjuana KavaAgnes I Nwoko, NP  ondansetron (ZOFRAN ODT) 4 MG disintegrating tablet Take 1 tablet (4 mg total) by mouth every 8 (eight) hours as needed for nausea or vomiting. 02/12/16   Sharman CheekPhillip Stafford, MD  oxyCODONE-acetaminophen (ROXICET) 5-325 MG tablet Take 1 tablet by mouth every 6 (six) hours as needed for severe pain. 02/12/16   Sharman CheekPhillip Stafford, MD  traZODone (DESYREL) 100 MG tablet Take 1 tablet (100 mg total) by mouth at bedtime as needed for sleep (may repeat x 1 in 1 hour if needed). 01/05/16   Sanjuana KavaAgnes I Nwoko, NP      PHYSICAL EXAMINATION:   VITAL SIGNS: Blood pressure 108/76, pulse (!) 112, temperature 98.1 F (36.7 C), temperature source Oral, resp. rate 20, height 5\' 5"  (1.651 m), weight 66.7 kg (147 lb), last menstrual period 07/20/2016, SpO2 99 %.  GENERAL:  25 y.o.-year-old patient lying in the bed with no acute distress.  EYES: Pupils equal, round, reactive to light and accommodation. No scleral icterus. Extraocular muscles intact.  HEENT: Head atraumatic, normocephalic. Oropharynx dry and nasopharynx clear.  NECK:  Supple, no jugular venous distention. No thyroid enlargement, no tenderness.  LUNGS: Normal breath sounds bilaterally, no wheezing, rales,rhonchi or crepitation. No use of accessory muscles of respiration.  CARDIOVASCULAR: S1, S2 normal. No murmurs, rubs, or gallops.  ABDOMEN: Soft, nontender, nondistended. Bowel sounds present. No organomegaly or mass.  EXTREMITIES: No pedal edema, cyanosis, or clubbing.  NEUROLOGIC: Cranial nerves II through XII are intact. Muscle  strength 5/5 in all extremities. Sensation intact. Gait not checked.  PSYCHIATRIC: The patient is alert and oriented x 3.  SKIN: No obvious rash, lesion, or ulcer.   LABORATORY PANEL:   CBC  Recent Labs Lab 08/03/16 0026  WBC 14.0*  HGB 13.4  HCT 38.6  PLT 472*  MCV 93.1  MCH 32.3  MCHC 34.7  RDW 13.8   ------------------------------------------------------------------------------------------------------------------  Chemistries   Recent Labs Lab 08/03/16 0026  NA 141  K 3.3*  CL 109  CO2 22  GLUCOSE 90  BUN 11  CREATININE 0.42*  CALCIUM 9.4  MG 2.3  AST 17  ALT 15  ALKPHOS 60  BILITOT 0.2*   ------------------------------------------------------------------------------------------------------------------ estimated creatinine clearance is 97.6 mL/min (by C-G formula based on SCr of 0.8 mg/dL). ------------------------------------------------------------------------------------------------------------------ No results for input(s): TSH, T4TOTAL, T3FREE, THYROIDAB in the last 72 hours.  Invalid input(s): FREET3   Coagulation profile No results for input(s): INR, PROTIME in the last 168 hours. ------------------------------------------------------------------------------------------------------------------- No results for input(s): DDIMER in the  last 72 hours. -------------------------------------------------------------------------------------------------------------------  Cardiac Enzymes No results for input(s): CKMB, TROPONINI, MYOGLOBIN in the last 168 hours.  Invalid input(s): CK ------------------------------------------------------------------------------------------------------------------ Invalid input(s): POCBNP  ---------------------------------------------------------------------------------------------------------------  Urinalysis    Component Value Date/Time   COLORURINE COLORLESS (A) 08/03/2016 0027   APPEARANCEUR CLEAR (A) 08/03/2016  0027   APPEARANCEUR Cloudy 07/29/2014 1934   LABSPEC 1.012 08/03/2016 0027   LABSPEC 1.020 07/29/2014 1934   PHURINE 5.0 08/03/2016 0027   GLUCOSEU NEGATIVE 08/03/2016 0027   GLUCOSEU Negative 07/29/2014 1934   HGBUR 1+ (A) 08/03/2016 0027   BILIRUBINUR NEGATIVE 08/03/2016 0027   BILIRUBINUR Negative 07/29/2014 1934   KETONESUR NEGATIVE 08/03/2016 0027   PROTEINUR NEGATIVE 08/03/2016 0027   UROBILINOGEN 0.2 10/18/2015 0122   NITRITE NEGATIVE 08/03/2016 0027   LEUKOCYTESUR NEGATIVE 08/03/2016 0027   LEUKOCYTESUR Trace 07/29/2014 1934     RADIOLOGY: No results found.  EKG: Orders placed or performed during the hospital encounter of 08/03/16  . EKG 12-Lead  . EKG 12-Lead    IMPRESSION AND PLAN: 26 year old female patient with history of depression, alcohol use, diverticular disease presented to the emergency room after consuming 45 tablets of aspirin.  Admitting diagnosis 1. Suicidal attempt 2. Depression 3. Salicylate overdose 4. Leukocytosis 5. Hypokalemia Treatment plan Admit patient to stepdown unit Patient involuntarily committed in the emergency room One-on-one observation for patient safety IV fluids with sodium bicarbonate infusion Follow-up salicylate level Psychiatric consultation Replace potassium Supportive care  All the records are reviewed and case discussed with ED provider. Management plans discussed with the patient, family and they are in agreement.  CODE STATUS:FULL Code Status History    Date Active Date Inactive Code Status Order ID Comments User Context   12/31/2015  3:53 PM 01/05/2016 10:09 PM Full Code [id number]  Charm Rings, NP Inpatient       TOTAL TIME TAKING CARE OF THIS PATIENT: 53 minutes.    Ihor Austin M.D on 08/03/2016 at 6:39 AM  Between 7am to 6pm - Pager - [phone]  After 6pm go to [email or web address] - password [email or web address] [facility]  [place] [facility]  Office  [phone]  CC: Primary care physician; No PCP Per  Patient

## 2016-08-03 NOTE — Progress Notes (Signed)
[facility] by Dr. Toni Amendlapacs.  Attending Physician will be Dr. Jennet MaduroPucilowska.   Patient [facility] room 305[facility] Marylu LundJanet.   Intake Paper Work has been signed and placed on patient chart.  Nehemiah Settle( [place] Patient's Nurse & Angelique Blonderenise Patient Access) have been made aware.   Please call report to [phone] at 4pm  08/03/2016 Cheryl FlashNicole Chett Taniguchi, MS, NCC, LPCA Therapeutic Triage Specialist

## 2016-08-03 NOTE — ED Notes (Signed)
MD at bedside. 

## 2016-08-03 NOTE — Progress Notes (Signed)
Salicylate level down to 8.  No complications.  Will discharge home or [facility] per psychiatry recommendations

## 2016-08-03 NOTE — Progress Notes (Signed)
Admission Note:  Patient arrived to the unit at approximately 1630 pm dressed in a hospital gown, gait steady.  Speech was soft and easily able to be understood.  Affect was sad and patient was tearful for part of the assessment.  Patient was calm and cooperative with the assessment. Patient made good eye contact, conversation was relevant.  Patient denied SI/HI/AVH.Patient states that she is allergic to latex and amoxicillin.  Patient skin assessment completed with two staff and skin was intact and appropriate for ethnicity.  Patient has one tattoo on upper back.  Patient belongings were searched and logged by staff and patient was oriented to the unit.

## 2016-08-03 NOTE — H&P (Signed)
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 combine stimulants with alcohol.  Alcohol use disorder, mild to moderate: The patient tends to minimize alcohol use. She does have a history of a DUI. She was advised to abstain from alcohol and all illicit drugs as they may worsen anxiety mood symptoms. We'll encourage the patient to enter a meaningful outpatient recovery program such as AA after discharge  Nicotine use disorder: The patient will be offered a nicotine patch. She was educated about negative consequences of tobacco use on health.  Disposition: The patient does have a stable living situation with her sister. She will need psychotropic medication management follow-up appointment [facility].  Daily contact with patient to assess and evaluate symptoms and progress in treatment and Medication management                   Physician Treatment Plan for Primary Diagnosis: Bipolar Disorder, Type II Long Term Goal(s): Improvement in symptoms so as ready for discharge  Short Term Goals: Ability to identify changes in lifestyle to reduce  recurrence of condition will improve, Ability to verbalize feelings will improve, Ability to disclose and discuss suicidal ideas, Ability to demonstrate self-control will improve, Ability to identify and develop effective coping behaviors will improve, Ability to maintain clinical measurements within normal limits will improve, Compliance with prescribed medications will improve and Ability to identify triggers associated with substance abuse/mental health issues will improve  Physician Treatment Plan for Secondary Diagnosis:  Alcohol Use Disorder, Mild to Moderate, Binge Drinking Type  Long Term Goal(s): Improvement in symptoms so as ready for discharge  Short Term Goals: Ability to identify changes in lifestyle  to reduce recurrence of condition will improve, Ability to verbalize feelings will improve, Ability to disclose and discuss suicidal ideas, Ability to demonstrate self-control will improve, Ability to identify and develop effective coping behaviors will improve, Ability to maintain clinical measurements within normal limits will improve, Compliance with prescribed medications will improve and Ability to identify triggers associated with substance abuse/mental health issues will improve  I certify that inpatient services furnished can reasonably be expected to improve the patient's condition.    Jay Schlichter, MD 9/1/20178:35 PM

## 2016-08-04 LAB — BASIC METABOLIC PANEL
Anion gap: 6 (ref 5–15)
BUN: 9 mg/dL (ref 6–20)
CO2: 26 mmol/L (ref 22–32)
Calcium: 9.2 mg/dL (ref 8.9–10.3)
Chloride: 106 mmol/L (ref 101–111)
Creatinine, Ser: 0.45 mg/dL (ref 0.44–1.00)
GFR calc Af Amer: >60 mL/min (ref >60)
GFR calc non Af Amer: >60 mL/min (ref >60)
Glucose, Bld: 96 mg/dL (ref 65–99)
Potassium: 4.3 mmol/L (ref 3.5–5.1)
Sodium: 138 mmol/L (ref 135–145)

## 2016-08-04 NOTE — [facility] Group Notes (Signed)
[facility] LCSW Group Therapy  08/04/2016 4:00 PM  Type of Therapy:  Group Therapy  Participation Level:  Active  Participation Quality:  Appropriate  Affect:  Appropriate  Cognitive:  Alert  Insight:  Improving  Engagement in Therapy:  Engaged  Modes of Intervention:  Activity, Clarification, Discussion, Problem-solving and Support  Summary of Progress/Problems: Protective Factors: Patients defined protective factors and discussed the importance recognizing their personal strengths. Patients identified their own protective factors and resiliency. Patients discussed building upon those protective factors to improve their ability to cope with life challenges. Patient shared with the group that she regrets her suicide attempt. CSW provided support to patient for being open with the group about her suicide attempt. Patient stated her protective factors are her family because they support her when she is at home and encourage [facility].    Natsumi Whitsitt G. Garnette CzechSampson MSW, LCSWA 08/04/2016, 4:03 PM

## 2016-08-04 NOTE — Progress Notes (Signed)
[facility] MD Progress Note  08/04/2016 4:11 PM Briana Davis  MRN:  [id number]   Subjective:   The patient reports that she has been doing fairly well and slept well last night.. She feels now that the impulsive decision to take the overdose was because she was not compliant with her medications and when she is on her medications, she feels less anxious. The patient feels that the better option would have been to talk about the situation with her sister. She denies any current active or passive suicidal thoughts progress overdose. The patient denies any psychotic symptoms including auditory or visual hallucinations. She denies any problems with insomnia or change in appetite, weight gain or weight loss. The patient denies any new somatic complaints.  Past psychiatric history The patient did have 1 prior inpatient psychiatric hospitalization in January 2017 at [facility] behavioral [facility]. She has been followed at [facility] in the past but has been noncompliant with psychotropic medications and follow-up visits. She does have a history of cutting in the past as well as an overdose. She has had trials of Adderall, Zoloft, Wellbutrin, Depakote and is now on a combination of Prozac and Lamictal but is been noncompliant with medication. She denies any residential rehabilitation and says that she would like to go to AA/NA.  Family psychiatric history: Patient reports that her mother and sister also struggled with panic disorder, depression and anxiety  Substance abuse history: The patient has a history of binge drinking alcohol. She reports that she drinks several times a week, [place]ley 7 or 8 beers each time. She does have a history of a DUI in the past as well. She tried marijuana but did not like the drug. She denies any cocaine, opioid, or stimulant use. No benzodiazepine abuse that she reports. She does smoke one pack of cigarettes per day and has been smoking since her teens  Social history The patient was born  and raised in [place] by her mom primarily as a 4 years old. She has one sister. She denies any history of any physical or sexual abuse. The patient dropped out of high school in the 11th grade and is currently working on getting her GED. 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 She does hope to eventually go to college. She has never been married and has no children and is not currently in a relationship.  Legal history  The patient has a history of a DUI and her mother. She has a pending charge for a traffic ticket and was to be in court this week .      Diagnosis:   Patient Active Problem List   Diagnosis Date Noted  . Bipolar 2 disorder, major depressive episode ([place]) [F31.81] 08/03/2016    Priority: High  . Alcohol dependence, binge pattern ([place]) [F10.20] 01/01/2016    Priority: High  . Mild alcohol abuse in early remission [F10.10] 12/15/2015    Priority: High  . Suicidal ideation [R45.851] 12/15/2015    Priority: High  . Social anxiety disorder [F40.10] 12/15/2015    Priority: Medium  . Attention deficit hyperactivity disorder (ADHD) [F90.9] 01/01/2016    Priority: Low  . Drug overdose [T50.901A] 08/03/2016  . Salicylate intoxication [T39.094A] 08/03/2016  . Substance induced mood disorder ([place]) [F19.94] 05/05/2016  . Involuntary commitment [Z04.6] 05/05/2016  . 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 Bipolar 2 disorder ([place]) [F31.81] 01/01/2016   Total Time spent with patient: 20 minutes   Past  Medical History:  Past Medical History:  Diagnosis Date  . Aggression   . Alcohol consumption binge drinking   . Depression   . Diverticulitis   . Kidney stone   . Suicidal ideation     Past Surgical History:  Procedure Laterality Date  . TONSILLECTOMY    . WISDOM TOOTH EXTRACTION     Family History:  Family History  Problem Relation Age of Onset  . Heart disease Mother   . Heart disease Father     Social  History:  History  Alcohol Use  . Yes    Comment: Sober for 20 days     History  Drug Use No    Social History   Social History  . Marital status: Single    Spouse name: N/A  . Number of children: N/A  . Years of education: N/A   Occupational History  . unemployed    Social History Main Topics  . Smoking status: Current Every Day Smoker    Packs/day: 1.00    Types: Cigarettes  . Smokeless tobacco: Never Used  . Alcohol use Yes     Comment: Sober for 20 days  . Drug use: No  . Sexual activity: Not Currently   Other Topics Concern  . None   Social History Narrative  . None        Sleep: Good  Appetite:  Good  Current Medications: Current Facility-Administered Medications  Medication Dose Route Frequency Provider Last Rate Last Dose  . acetaminophen (TYLENOL) suppository 650 mg  650 mg Rectal Q6H PRN Gonzella Lex, MD      . acetaminophen (TYLENOL) tablet 650 mg  650 mg Oral Q6H PRN Gonzella Lex, MD   650 mg at 08/04/16 0818  . alum & mag hydroxide-simeth (MAALOX/MYLANTA) 200-200-20 MG/5ML suspension 30 mL  30 mL Oral Q4H PRN Gonzella Lex, MD      . FLUoxetine (PROZAC) capsule 40 mg  40 mg Oral Daily Gonzella Lex, MD   40 mg at 08/04/16 0817  . lamoTRIgine (LAMICTAL) tablet 25 mg  25 mg Oral Daily Gonzella Lex, MD   25 mg at 08/04/16 0817  . magnesium hydroxide (MILK OF MAGNESIA) suspension 30 mL  30 mL Oral Daily PRN Gonzella Lex, MD      . nicotine (NICODERM CQ - dosed in mg/24 hours) patch 21 mg  21 mg Transdermal Daily Gonzella Lex, MD   21 mg at 08/04/16 0817  . ondansetron (ZOFRAN) tablet 4 mg  4 mg Oral Q6H PRN Gonzella Lex, MD       Or  . ondansetron (ZOFRAN) injection 4 mg  4 mg Intravenous Q6H PRN Gonzella Lex, MD      . senna-docusate (Senokot-S) tablet 1 tablet  1 tablet Oral QHS PRN Gonzella Lex, MD      . traZODone (DESYREL) tablet 100 mg  100 mg Oral QHS PRN Gonzella Lex, MD   100 mg at 08/03/16 2125    Lab Results:   Results for orders placed or performed during the hospital encounter of 08/03/16 (from the past 48 hour(s))  Basic metabolic panel     Status: None   Collection Time: 08/04/16  6:44 AM  Result Value Ref Range   Sodium 138 135 - 145 mmol/L   Potassium 4.3 3.5 - 5.1 mmol/L   Chloride 106 101 - 111 mmol/L   CO2 26 22 - 32 mmol/L   Glucose, Bld 96  65 - 99 mg/dL   BUN 9 6 - 20 mg/dL   Creatinine, Ser 0.45 0.44 - 1.00 mg/dL   Calcium 9.2 8.9 - 10.3 mg/dL   GFR calc non Af Amer >60 >60 mL/min   GFR calc Af Amer >60 >60 mL/min    Comment: (NOTE) The eGFR has been calculated using the CKD EPI equation. This calculation has not been validated in all clinical situations. eGFR's persistently <60 mL/min signify possible Chronic Kidney Disease.    Anion gap 6 5 - 15    Blood Alcohol level:  Lab Results  Component Value Date   ETH 158 (H) 08/03/2016   ETH 282 (H) 40/98/1191    Metabolic Disorder Labs: No results found for: HGBA1C, MPG No results found for: PROLACTIN No results found for: CHOL, TRIG, HDL, CHOLHDL, VLDL, LDLCALC   Musculoskeletal: Strength & Muscle Tone: within normal limits Gait & Station: normal Patient leans: N/A  Psychiatric Specialty Exam: Physical Exam  Review of Systems  Constitutional: Negative.  Negative for chills, fever and malaise/fatigue.  HENT: Negative.  Negative for ear discharge, ear pain, hearing loss and tinnitus.   Eyes: Negative.  Negative for blurred vision, photophobia and pain.  Respiratory: Negative.  Negative for cough, hemoptysis, sputum production, shortness of breath and wheezing.   Cardiovascular: Negative.  Negative for chest pain, palpitations, orthopnea and leg swelling.  Gastrointestinal: Negative.  Negative for abdominal pain, constipation, diarrhea, heartburn, nausea and vomiting.  Genitourinary: Negative.  Negative for dysuria, frequency and urgency.  Musculoskeletal: Negative.  Negative for back pain, falls, joint pain,  myalgias and neck pain.  Skin: Negative.  Negative for itching and rash.  Neurological: Negative.  Negative for dizziness, tingling, tremors, sensory change, speech change, focal weakness, seizures, loss of consciousness and headaches.  Endo/Heme/Allergies: Negative.  Negative for environmental allergies. Does not bruise/bleed easily.    Blood pressure 120/78, Davis 99, temperature 97.5 F (36.4 C), resp. rate 16, height 5' 5"  (1.651 m), weight 66.2 kg (146 lb), last menstrual period 07/20/2016, SpO2 100 %.Body mass index is 24.3 kg/m.  General Appearance: Casual  Eye Contact:  Good  Speech:  Clear and Coherent and Normal Rate  Volume:  Normal  Mood:  I feel better  Affect:  Congruent  Thought Process:  Coherent, Goal Directed and Linear  Orientation:  Full (Time, Place, and Person)  Thought Content:  Logical  Suicidal Thoughts:  No  Homicidal Thoughts:  No  Memory:  Immediate;   Good Recent;   Good Remote;   Good  Judgement:  Good  Insight:  Good  Psychomotor Activity:  Normal  Concentration:  Concentration: Good and Attention Span: Good  Recall:  Good  Fund of Knowledge:  Good  Language:  Good  Akathisia:  No  Handed:  Right  AIMS (if indicated):     Assets:  Communication Skills Desire for Improvement Housing Physical Health Transportation  ADL's:  Intact  Cognition:  WNL  Sleep:  Number of Hours: 8     Treatment Plan Summary:   Ms. Hechavarria is a 25 year old Caucasian female with a history of bipolar disorder, type II as well as alcohol use disorder who overdosed on handful of aspirin in the context of drinking alcohol. The patient had an argument with her sister prior to overdosing but denies any suicidal intent. She will be admitted to inpatient psychiatry for medication management, safety and stabilization.   Bipolar disorder, type II, social anxiety disorder: The patient is opposed to starting a mood  stabilizer such as Depakote or Tegretol and she felt Dr. Sabra Heck  advised her against this. She wants to stay with Lamictal but has never actually increased Lamictal in the past one year above 25 mg. She will remain on Lamictal for the next 1 week at 25 mg and then plan to increase to 50 mg by mouth daily for mood stabilization. Will continue on Prozac 40 mg by mouth daily for anxiety and depression as the patient feels this antidepressant has worked better than other antidepressants in the past. She was not compliant with psychotropic medications as an outpatient which may have contributed to worsening mood symptoms. She currently denies any suicidal thoughts but will be placed on suicide precautions.   ADHD, combined type: Secondary to problems with hypomania times, will avoid stimulants. She was not on any stimulants prior to admission. The patient also should not combine stimulants with alcohol.   Alcohol use disorder, mild to moderate: The patient tends to minimize alcohol use. She does have a history of a DUI. She was advised to abstain from alcohol and all illicit drugs as they may worsen anxiety mood symptoms. We'll encourage the patient to enter a meaningful outpatient recovery program such as AA after discharge   Nicotine use disorder: The patient will be offered a nicotine patch. She was educated about negative consequences of tobacco use on health.   Disposition: The patient does have a stable living situation with her sister. She will need psychotropic medication management follow-up appointment [facility].   Daily contact with patient to assess and evaluate symptoms and progress in treatment and Medication management  Jay Schlichter, MD 08/04/2016, 4:11 PM

## 2016-08-04 NOTE — Progress Notes (Signed)
Pt denies SI/HI/AVH. Affect pleasant. Appropriate with peers and staff. Requested PRN trazodone for sleep, effective. Pt denies pain and voices no additional concerns at this time. Will continue to monitor.

## 2016-08-04 NOTE — [facility] Counselor (Signed)
Adult Comprehensive Assessment  Patient ID: Briana Davis, female   DOB: Mar 02, 1991, 25 y.o.   MRN: [id number]  Information Source: Information source: Patient  Current Stressors:  Educational / Learning stressors: n/a Employment / Job issues: Patient is unemployed. Family Relationships: n/a Surveyor, quantity / Lack of resources (include bankruptcy): n/a Housing / Lack of housing: n/a Physical health (include injuries & life threatening diseases): n/a Social relationships: n/a Substance abuse: Patient denies Bereavement / Loss: n/a  Living/Environment/Situation:  Living Arrangements: Other relatives Living conditions (as described by patient or guardian): Patient reports living situation is great.  How long has patient lived in current situation?: 4 months What is atmosphere in current home: Comfortable, Paramedic, Supportive  Family History:  Marital status: Single Are you sexually active?: No What is your sexual orientation?: Heterosexual Has your sexual activity been affected by drugs, alcohol, medication, or emotional stress?: n/a Does patient have children?: No  Childhood History:  By whom was/is the patient raised?: Mother Additional childhood history information: Father died when she was 4yo Description of patient's relationship with caregiver when they were a child: Mother drank a lot when pt was a child, but was a good mother. Patient's description of current relationship with people who raised him/her: Patient states her mother is her best friend.  How were you disciplined when you got in trouble as a child/adolescent?: n/a Does patient have siblings?: Yes Number of Siblings: 1 Description of patient's current relationship with siblings: Patient currently lives with her sister.  Did patient suffer any verbal/emotional/physical/sexual abuse as a child?: No Did patient suffer from severe childhood neglect?: No Has patient ever been sexually abused/assaulted/raped as an  adolescent or adult?: No Was the patient ever a victim of a crime or a disaster?: No Witnessed domestic violence?: No Has patient been effected by domestic violence as an adult?: No  Education:  Highest grade of school patient has completed: 11th Currently a student?: No Learning disability?: No  Employment/Work Situation:   Employment situation: Unemployed Patient's job has been impacted by current illness: No What is the longest time patient has a held a job?: 3 years Where was the patient employed at that time?: Waitress Has patient ever been in the [employer]?: No Has patient ever served in combat?: No Did You Receive Any Psychiatric Treatment/Services While in Equities trader?: No Are There Guns or Education officer, community in Your Home?: No Are These Comptroller?:  (n/a)  Financial Resources:   Financial resources: Support from parents / caregiver Does patient have a Lawyer or guardian?: No  Alcohol/Substance Abuse:   What has been your use of drugs/alcohol within the last 12 months?: Alcohol and Benzo's If attempted suicide, did drugs/alcohol play a role in this?: Yes Alcohol/Substance Abuse Treatment Hx: Attends AA/NA Has alcohol/substance abuse ever caused legal problems?: No (Patient DWI when she was 22 but nothing current. )  Social Support System:   Patient's Community Support System: Good Describe Community Support System: Patient has the support from her sister and mother.  Type of faith/religion: Pentecostal How does patient's faith help to cope with current illness?: Patient states that she prays but does use religion as a support.   Leisure/Recreation:   Leisure and Hobbies: Reading and taking care of animals, taking baths to relax  Strengths/Needs:   What things does the patient do well?: "Being there for other", "Sense of humor" In what areas does patient struggle / problems for patient: low self-esteem, anxiety, depression  Discharge Plan:  Does patient have access to transportation?: Yes (sister) Will patient be returning to same living situation after discharge?: Yes Currently receiving community mental health services: Yes (From Whom) ([facility]) Does patient have financial barriers related to discharge medications?: No  Summary/Recommendations:   Patient is a 25 year old female admitted involuntarily with a diagnosis of Bipolar 2 disorder, major depressive episode. Patient presented to the hospital after a suicide attempt by overdose on aspirin. Patient reports primary triggers for admission were a disagreement with her sister, alcohol use, not taking her medications, and severe anxiety. Patient has established outpatient services with [facility]. Patient would like to continue her outpatient services with [facility] for medication management and outpatient therapy. Patient has the support of her sister and mother. Patient is unemployed but has financial support from her sister. Patient plans to discharge back home to live with her sister. Patient will benefit from crisis stabilization, medication evaluation, group therapy and psycho education in addition to case management for discharge. At discharge, it is recommended that patient remain compliant with established discharge plan and continued treatment.    Lithzy Bernard G. Garnette CzechSampson MSW, [facility] Center[facility]WA 08/04/2016 3:14 PM

## 2016-08-04 NOTE — Plan of Care (Signed)
Problem: Safety: Goal: Ability to remain free from injury will improve Outcome: Progressing No self harm reported or observed. Pt reports she is not suicidal

## 2016-08-04 NOTE — [facility] Group Notes (Signed)
[facility] Group Notes:  (Nursing/MHT/Case Management/Adjunct)  Date:  08/04/2016  Time:  5:32 AM  Type of Therapy:  Psychoeducational Skills  Participation Level:  Active  Participation Quality:  Appropriate and Attentive  Affect:  Appropriate  Cognitive:  Appropriate  Insight:  Appropriate and Good  Engagement in Group:  Engaged  Modes of Intervention:  Discussion, Socialization and Support  Summary of Progress/Problems:  Briana MilroyLaquanda Y Kamauri Davis 08/04/2016, 5:32 AM

## 2016-08-04 NOTE — Progress Notes (Signed)
D: Patient presents pleasant and cooperative with care. Reports sleeping good with medication. Appetite, concentration is good, energy level normal. Pt smiling with nurse and peers. Reports no anxiety, depression, SI, HI, AVH. Pt reports SI attempt was stupid and she is unsure why she did it. Reports she had not taken her medication, but now knows that she needs to continue to take meds.  A: Encouragement and support offered. Medications administered as prescribed. Safety checks maintained.  R: Pt receptive, attending groups, taking prescribed medications. Pt reports goal to stay positive and hopeful. Remains safe on unit with q 15 min checks.

## 2016-08-05 MED ORDER — LAMOTRIGINE 25 MG PO TABS
50.0000 mg | ORAL_TABLET | Freq: Every day | ORAL | Status: DC
  Administered 2016-08-06: 50 mg via ORAL
  Filled 2016-08-05: qty 2

## 2016-08-05 NOTE — Progress Notes (Signed)
Pt has been pleasant and cooperative. Pt talked about events leading to hospitalization . Pt denies SI, HI, and A/V hallucinations. Pt has been active on the unit. Pt has been attending all unit activities.

## 2016-08-05 NOTE — [facility] Group Notes (Signed)
[facility] LCSW Group Therapy  08/05/2016 2:40 PM  Type of Therapy:  Group Therapy  Participation Level:  Active  Participation Quality:  Appropriate  Affect:  Appropriate  Cognitive:  Alert  Insight:  Improving  Engagement in Therapy:  Engaged  Modes of Intervention:  Activity, Discussion and Support  Summary of Progress/Problems:Coping Skills: Patients defined and discussed healthy coping skills. Patients identified healthy coping skills they would like to try during hospitalization and after discharge. CSW offered insight to varying coping skills that may have been new to patients such as practicing mindfulness. Patient stated she was in a good mood today. Patient stated that deep breathing exercises assist her when she is experiencing significant anxiety.   Vannary Greening G. Garnette CzechSampson MSW, LCSWA 08/05/2016, 2:40 PM

## 2016-08-05 NOTE — Progress Notes (Signed)
Pt denies SI/HI/AVH. Affect pleasant. Appropriate with peers and staff. Attended evening group. Requested PRN trazodone for sleep, effective. Pt denies pain and voices no additional concerns at this time. Will continue to monitor

## 2016-08-05 NOTE — Progress Notes (Signed)
[facility] MD Progress Note  08/05/2016 7:13 PM Briana Davis  MRN:  [id number]   Subjective:   Briana Davis is a 25 year old Caucasian female with a history of bipolar disorder who was admitted to the hospital after overdosing on aspirin. As a history of noncompliance with psychotropic medications prior to admission. She regrets the overdose and feels it was impulsive.  The patient reports that she has been doing well and mood has improved since yesterday. Her mother and sister came to visit and the patient feels convinced that if she had been compliant with her medication, she would not have engaged in any impulsive behaviors. She denies any current active or passive suicidal thoughts or psychotic symptoms. The patient has been attending groups and feels like learning breathing techniques and mindfulness techniques have helped. She also feels like she needs to learn anger management and coping skills for her anger. The patient admits to having some controlling behaviors especially over her sister which led to the argument and eventually the overdose. She denies any problems with insomnia and let 6 hours last night. Appetite is good. She has been interacting well with staff and peers. She is looking forward to discharge hopefully early this week. Supportive psychotherapy provided and times spent discussing coping skills to help with anger management. Time also spent encouraging the patient to use mindfulness skills to help with anxiety.   Past psychiatric history The patient did have 1 prior inpatient psychiatric hospitalization in January 2017 at [facility] behavioral [facility]. She has been followed at [facility] in the past but has been noncompliant with psychotropic medications and follow-up visits. She does have a history of cutting in the past as well as an overdose. She has had trials of Adderall, Zoloft, Wellbutrin, Depakote and is now on a combination of Prozac and Lamictal but is been noncompliant with medication.  She denies any residential rehabilitation and says that she would like to go to AA/NA.  Family psychiatric history: Patient reports that her mother and sister also struggled with panic disorder, depression and anxiety  Substance abuse history: The patient has a history of binge drinking alcohol. She reports that she drinks several times a week, [place] 7 or 8 beers each time. She does have a history of a DUI in the past as well. She tried marijuana but did not like the drug. She denies any cocaine, opioid, or stimulant use. No benzodiazepine abuse that she reports. She does smoke one pack of cigarettes per day and has been smoking since her teens  Social history The patient was born and raised in [place] by her mom primarily as a 32 years old. She has one sister. She denies any history of any physical or sexual abuse. The patient dropped out of high school in the 11th grade and is currently working on getting her GED. 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 She does hope to eventually go to college. She has never been married and has no children and is not currently in a relationship.  Legal history  The patient has a history of a DUI and her mother. She has a pending charge for a traffic ticket and was to be in court this week .      Diagnosis:   Patient Active Problem List   Diagnosis Date Noted  . Bipolar 2 disorder, major depressive episode ([place]) [F31.81] 08/03/2016    Priority: High  . Alcohol dependence, binge pattern ([place]) [F10.20] 01/01/2016    Priority: High  .  Mild alcohol abuse in early remission [F10.10] 12/15/2015    Priority: High  . Suicidal ideation [R45.851] 12/15/2015    Priority: High  . Social anxiety disorder [F40.10] 12/15/2015    Priority: Medium  . Attention deficit hyperactivity disorder (ADHD) [F90.9] 01/01/2016    Priority: Low  . Drug overdose [T50.901A] 08/03/2016  . Salicylate intoxication [T39.094A] 08/03/2016  . Substance  induced mood disorder ([place]) [F19.94] 05/05/2016  . Involuntary commitment [Z04.6] 05/05/2016  . 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 Bipolar 2 disorder ([place]) [F31.81] 01/01/2016   Total Time spent with patient: 20 minutes   Past Medical History:  Past Medical History:  Diagnosis Date  . Aggression   . Alcohol consumption binge drinking   . Depression   . Diverticulitis   . Kidney stone   . Suicidal ideation     Past Surgical History:  Procedure Laterality Date  . TONSILLECTOMY    . WISDOM TOOTH EXTRACTION     Family History:  Family History  Problem Relation Age of Onset  . Heart disease Mother   . Heart disease Father     Social History:  History  Alcohol Use  . Yes    Comment: Sober for 20 days     History  Drug Use No    Social History   Social History  . Marital status: Single    Spouse name: N/A  . Number of children: N/A  . Years of education: N/A   Occupational History  . unemployed    Social History Main Topics  . Smoking status: Current Every Day Smoker    Packs/day: 1.00    Types: Cigarettes  . Smokeless tobacco: Never Used  . Alcohol use Yes     Comment: Sober for 20 days  . Drug use: No  . Sexual activity: Not Currently   Other Topics Concern  . None   Social History Narrative  . None        Sleep: Good  Appetite:  Good  Current Medications: Current Facility-Administered Medications  Medication Dose Route Frequency Provider Last Rate Last Dose  . acetaminophen (TYLENOL) suppository 650 mg  650 mg Rectal Q6H PRN Gonzella Lex, MD      . acetaminophen (TYLENOL) tablet 650 mg  650 mg Oral Q6H PRN Gonzella Lex, MD   650 mg at 08/05/16 1411  . alum & mag hydroxide-simeth (MAALOX/MYLANTA) 200-200-20 MG/5ML suspension 30 mL  30 mL Oral Q4H PRN Gonzella Lex, MD      . FLUoxetine (PROZAC) capsule 40 mg  40 mg Oral Daily Gonzella Lex, MD   40 mg at 08/05/16 0842  . [START ON 08/06/2016]  lamoTRIgine (LAMICTAL) tablet 50 mg  50 mg Oral Daily Chauncey Mann, MD      . magnesium hydroxide (MILK OF MAGNESIA) suspension 30 mL  30 mL Oral Daily PRN Gonzella Lex, MD      . nicotine (NICODERM CQ - dosed in mg/24 hours) patch 21 mg  21 mg Transdermal Daily Gonzella Lex, MD   21 mg at 08/05/16 0842  . ondansetron (ZOFRAN) tablet 4 mg  4 mg Oral Q6H PRN Gonzella Lex, MD       Or  . ondansetron (ZOFRAN) injection 4 mg  4 mg Intravenous Q6H PRN Gonzella Lex, MD      . senna-docusate (Senokot-S) tablet 1 tablet  1 tablet Oral QHS PRN Gonzella Lex, MD      .  traZODone (DESYREL) tablet 100 mg  100 mg Oral QHS PRN Gonzella Lex, MD   100 mg at 08/04/16 2251    Lab Results:  Results for orders placed or performed during the hospital encounter of 08/03/16 (from the past 48 hour(s))  Basic metabolic panel     Status: None   Collection Time: 08/04/16  6:44 AM  Result Value Ref Range   Sodium 138 135 - 145 mmol/L   Potassium 4.3 3.5 - 5.1 mmol/L   Chloride 106 101 - 111 mmol/L   CO2 26 22 - 32 mmol/L   Glucose, Bld 96 65 - 99 mg/dL   BUN 9 6 - 20 mg/dL   Creatinine, Ser 0.45 0.44 - 1.00 mg/dL   Calcium 9.2 8.9 - 10.3 mg/dL   GFR calc non Af Amer >60 >60 mL/min   GFR calc Af Amer >60 >60 mL/min    Comment: (NOTE) The eGFR has been calculated using the CKD EPI equation. This calculation has not been validated in all clinical situations. eGFR's persistently <60 mL/min signify possible Chronic Kidney Disease.    Anion gap 6 5 - 15    Blood Alcohol level:  Lab Results  Component Value Date   ETH 158 (H) 08/03/2016   ETH 282 (H) 61/68/3729    Metabolic Disorder Labs: No results found for: HGBA1C, MPG No results found for: PROLACTIN No results found for: CHOL, TRIG, HDL, CHOLHDL, VLDL, LDLCALC   Musculoskeletal: Strength & Muscle Tone: within normal limits Gait & Station: normal Patient leans: N/A  Psychiatric Specialty Exam: Physical Exam   Review of Systems   Constitutional: Negative.  Negative for chills, fever and malaise/fatigue.  HENT: Negative.  Negative for ear discharge, ear pain, hearing loss and tinnitus.   Eyes: Negative.  Negative for blurred vision, photophobia and pain.  Respiratory: Negative.  Negative for cough, hemoptysis, sputum production, shortness of breath and wheezing.   Cardiovascular: Negative.  Negative for chest pain, palpitations, orthopnea and leg swelling.  Gastrointestinal: Negative.  Negative for abdominal pain, constipation, diarrhea, heartburn, nausea and vomiting.  Genitourinary: Negative.  Negative for dysuria, frequency and urgency.  Musculoskeletal: Negative.  Negative for back pain, falls, joint pain, myalgias and neck pain.  Skin: Negative.  Negative for itching and rash.  Neurological: Negative.  Negative for dizziness, tingling, tremors, sensory change, speech change, focal weakness, seizures, loss of consciousness and headaches.  Endo/Heme/Allergies: Negative.  Negative for environmental allergies. Does not bruise/bleed easily.    Blood pressure 118/70, pulse 95, temperature 98.2 F (36.8 C), temperature source Oral, resp. rate 18, height 5' 5"  (1.651 m), weight 66.2 kg (146 lb), last menstrual period 07/20/2016, SpO2 100 %.Body mass index is 24.3 kg/m.  General Appearance: Casual  Eye Contact:  Good  Speech:  Clear and Coherent and Normal Rate  Volume:  Normal  Mood:  Much better  Affect:  Congruent  Thought Process:  Coherent, Goal Directed and Linear  Orientation:  Full (Time, Place, and Person)  Thought Content:  Logical  Suicidal Thoughts:  No  Homicidal Thoughts:  No  Memory:  Immediate;   Good Recent;   Good Remote;   Good  Judgement:  Good  Insight:  Good  Psychomotor Activity:  Normal  Concentration:  Concentration: Good and Attention Span: Good  Recall:  Good  Fund of Knowledge:  Good  Language:  Good  Akathisia:  No  Handed:  Right  AIMS (if indicated):     Assets:   Communication Skills Desire  for Improvement Housing Physical Health Transportation  ADL's:  Intact  Cognition:  WNL  Sleep:  Number of Hours: 6.3     Treatment Plan Summary:   Briana Davis is a 25 year old Caucasian female with a history of bipolar disorder, type II as well as alcohol use disorder who overdosed on handful of aspirin in the context of drinking alcohol. The patient had an argument with her sister prior to overdosing but denies any suicidal intent. She will be admitted to inpatient psychiatry for medication management, safety and stabilization.   Bipolar disorder, type II, social anxiety disorder: The patient is opposed to starting a mood stabilizer such as Depakote or Tegretol and she felt Dr. Sabra Heck advised her against this. She wants to stay with Lamictal but has never actually increased Lamictal in the past one year above 25 mg. Will increase Lamictal to 50 mg by mouth daily for mood stabilization and she will have to titrate up as an outpatient. Will continue on Prozac 40 mg by mouth daily for anxiety and depression as the patient feels this antidepressant has worked better than other antidepressants in the past. She was not compliant with psychotropic medications as an outpatient which may have contributed to worsening mood symptoms. She currently denies any suicidal thoughts but will be placed on suicide precautions.   ADHD, combined type: Secondary to problems with hypomania times, will avoid stimulants. She was not on any stimulants prior to admission. The patient also should not combine stimulants with alcohol.   Alcohol use disorder, mild to moderate: The patient tends to minimize alcohol use. She does have a history of a DUI. She was advised to abstain from alcohol and all illicit drugs as they may worsen anxiety mood symptoms. We'll encourage the patient to enter a meaningful outpatient recovery program such as AA after discharge   Nicotine use disorder: The patient will  be offered a nicotine patch. She was educated about negative consequences of tobacco use on health.   Disposition: The patient does have a stable living situation with her sister. She will need psychotropic medication management follow-up appointment [facility].   Daily contact with patient to assess and evaluate symptoms and progress in treatment and Medication management  Jay Schlichter, MD 08/05/2016, 7:13 PM

## 2016-08-05 NOTE — [facility] Group Notes (Signed)
[facility] Group Notes:  (Nursing/MHT/Case Management/Adjunct)  Date:  08/05/2016  Time:  3:52 AM  Type of Therapy:  Psychoeducational Skills  Participation Level:  Active  Participation Quality:  Appropriate, Attentive, Sharing and Supportive  Affect:  Appropriate  Cognitive:  Alert and Appropriate  Insight:  Appropriate and Good  Engagement in Group:  Engaged  Modes of Intervention:  Discussion, Socialization and Support  Summary of Progress/Problems:  Briana MilroyLaquanda Y Conard Davis 08/05/2016, 3:52 AM

## 2016-08-05 NOTE — Plan of Care (Signed)
Problem: Activity: Goal: Interest or engagement in leisure activities will improve Outcome: Progressing Pt visible in milieu socializing during free time.

## 2016-08-05 NOTE — [facility] Suicide Risk Assessment (Signed)
[facility] INPATIENT:  Family/Significant Other Suicide Prevention Education  Suicide Prevention Education:  Education Completed; Ines BloomerRuby Heffelfinger (mother [phone]), has been identified by the patient as the family member/significant other with whom the patient will be residing, and identified as the person(s) who will aid the patient in the event of a mental health crisis (suicidal ideations/suicide attempt).  With written consent from the patient, the family member/significant other has been provided the following suicide prevention education, prior to the and/or following the discharge of the patient. Mother states she will ensure that patient continues to take her prescribed medications when discharged.   The suicide prevention education provided includes the following:  Suicide risk factors  Suicide prevention and interventions  National Suicide Hotline telephone number  [facility] assessment telephone number  [facility] Emergency Assistance 911  [facility] and/or Residential Mobile Crisis Unit telephone number  Request made of family/significant other to:  Remove weapons (e.g., guns, rifles, knives), all items previously/currently identified as safety concern.    Remove drugs/medications (over-the-counter, prescriptions, illicit drugs), all items previously/currently identified as a safety concern. Mother states she will monitor all medications in when patient returns home.   The family member/significant other verbalizes understanding of the suicide prevention education information provided.  The family member/significant other agrees to remove the items of safety concern listed above.  Lova Urbieta G. Garnette CzechSampson MSW, LCSWA 08/05/2016, 9:54 AM

## 2016-08-06 DIAGNOSIS — F3181 Bipolar II disorder: Principal | ICD-10-CM

## 2016-08-06 MED ORDER — TRAZODONE HCL 100 MG PO TABS: 100.0000 mg | ORAL_TABLET | Freq: Every evening | ORAL | 0 refills | Status: DC | PRN

## 2016-08-06 MED ORDER — TRAZODONE HCL 100 MG PO TABS: 100.0000 mg | ORAL_TABLET | Freq: Every evening | ORAL | 1 refills | Status: DC | PRN

## 2016-08-06 MED ORDER — FLUOXETINE HCL 40 MG PO CAPS: 40.0000 mg | ORAL_CAPSULE | Freq: Every day | ORAL | 0 refills | Status: DC

## 2016-08-06 MED ORDER — NICOTINE 21 MG/24HR TD PT24: 21.0000 mg | MEDICATED_PATCH | Freq: Every day | TRANSDERMAL | 0 refills | Status: DC

## 2016-08-06 MED ORDER — LAMOTRIGINE 25 MG PO TABS: 50.0000 mg | ORAL_TABLET | Freq: Every day | ORAL | 0 refills | Status: DC

## 2016-08-06 MED ORDER — LAMOTRIGINE 25 MG PO TABS: 50.0000 mg | ORAL_TABLET | Freq: Every day | ORAL | 1 refills | Status: DC

## 2016-08-06 MED ORDER — FLUOXETINE HCL 40 MG PO CAPS: 40.0000 mg | ORAL_CAPSULE | Freq: Every day | ORAL | 1 refills | Status: DC

## 2016-08-06 NOTE — [facility] Group Notes (Signed)
[facility] Group Notes:  (Nursing/MHT/Case Management/Adjunct)  Date:  08/06/2016  Time:  5:21 AM  Type of Therapy:  Psychoeducational Skills  Participation Level:  Active  Participation Quality:  Appropriate, Sharing and Supportive  Affect:  Appropriate  Cognitive:  Appropriate  Insight:  Appropriate and Good  Engagement in Group:  Engaged and Supportive  Modes of Intervention:  Discussion, Socialization and Support  Summary of Progress/Problems:  Chancy MilroyLaquanda Y Delight Bickle 08/06/2016, 5:21 AM

## 2016-08-06 NOTE — Progress Notes (Signed)
Pt denies SI/HI/AVH. Affect pleasant. Appropriate with peers and staff. Attended evening group. Requested PRN trazodone for sleep, effective. Pt denies pain and voices no additional concerns at this time. [facility]

## 2016-08-06 NOTE — Progress Notes (Signed)
  [facility] :  Will you be returning to the same living situation after discharge:  Yes,    At discharge, do you have transportation home?: Yes,   sister will pick up Do you have the ability to pay for your medications: Yes,   sister helps, also referred to [facility]  Release of information consent forms completed and in the chart;  Patient's signature needed at discharge.  Patient to Follow up at: Follow-up Information    [facility] Go on 08/08/2016.   Why:  7:00am Please keep in touch with Peer support Specialist, Mr. Unk PintoHarvey Bryant at [phone] at discharge he plans to assist you [facility]. Contact information: [address] [place] [address] [phone]           Next level of care provider has access to [facility] Link:no  Safety Planning and Suicide Prevention discussed: Yes,     Have you used any form of tobacco in the last 30 days? (Cigarettes, Smokeless Tobacco, Cigars, and/or Pipes): Yes  Has patient been referred to the Quitline?: Patient refused referral  Patient has been referred for addiction treatment: Yes  Glennon MacSara P Mortimer Bair, MSW, LCSW 08/06/2016, 3:01 PM

## 2016-08-06 NOTE — [facility] Suicide Risk Assessment (Signed)
[facility]   Principal Problem: Bipolar 2 disorder, major depressive episode [facility](HCC) Discharge Diagnoses:  Patient Active Problem List   Diagnosis Date Noted  . Drug overdose [T50.901A] 08/03/2016  . Salicylate intoxication [T39.094A] 08/03/2016  . Bipolar 2 disorder, major depressive episode ([facility]) [F31.81] 08/03/2016  . Substance induced mood disorder ([facility]) [F19.94] 05/05/2016  . Involuntary commitment [Z04.6] 05/05/2016  . 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 Attention deficit hyperactivity disorder (ADHD) [F90.9] 01/01/2016  . Bipolar 2 disorder ([facility]) [F31.81] 01/01/2016  . Social anxiety disorder [F40.10] 12/15/2015  . 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 Suicidal ideation [R45.851] 12/15/2015    Total Time spent with patient: 35 minutes  Musculoskeletal: Strength & Muscle Tone: within normal limits Gait & Station: normal Patient leans: N/A  Psychiatric Specialty Exam: Review of Systems  Constitutional: Negative.   HENT: Negative.   Eyes: Negative.   Respiratory: Negative.   Cardiovascular: Negative.   Gastrointestinal: Negative.   Musculoskeletal: Negative.   Skin: Negative.   Neurological: Negative.   Psychiatric/Behavioral: Negative for depression, hallucinations, memory loss, substance abuse and suicidal ideas. The patient is not nervous/anxious and does not have insomnia.     Blood pressure 117/71, pulse 97, temperature 98.5 F (36.9 C), resp. rate 18, height 5\' 5"  (1.651 m), weight 66.2 kg (146 lb), last menstrual period 07/20/2016, SpO2 100 %.Body mass index is 24.3 kg/m.  General Appearance: Fairly Groomed  Patent attorneyye Contact::  Good  Speech:  Clear and Coherent409  Volume:  Normal  Mood:  Euthymic  Affect:  Congruent  Thought Process:  Goal Directed  Orientation:  Full (Time, Place, and Person)  Thought Content:   Logical  Suicidal Thoughts:  No  Homicidal Thoughts:  No  Memory:  Immediate;   Good Recent;   Fair Remote;   Good  Judgement:  Fair  Insight:  Fair  Psychomotor Activity:  Normal  Concentration:  Good  Recall:  Good  Fund of Knowledge:Good  Language: Good  Akathisia:  No  Handed:  Right  AIMS (if indicated):     Assets:  Communication Skills Desire for Improvement Financial Resources/Insurance Housing Physical Health Resilience Social Support  Sleep:  Number of Hours: 7.15  Cognition: WNL  ADL's:  Intact   Mental Status Per Nursing Assessment::   On Admission:     Demographic Factors:  Adolescent or young adult and Caucasian  Loss Factors: Loss of significant relationship  Historical Factors: Prior suicide attempts and Impulsivity  Risk Reduction Factors:   Sense of responsibility to family, Religious beliefs about death, Positive social support and Positive therapeutic relationship  Continued Clinical Symptoms:  Bipolar Disorder:   Bipolar II  Cognitive Features That Contribute To Risk:  Loss of executive function    Suicide Risk:  Mild:  Suicidal ideation of limited frequency, intensity, duration, and specificity.  There are no identifiable plans, no associated intent, mild dysphoria and related symptoms, good self-control (both objective and subjective assessment), few other risk factors, and identifiable protective factors, including available and accessible social support.  Follow-up Information    [facility] Follow up on 08/08/2016.   Why:  Go to [facility] during walk-in hours on 08/08/2016 between 8-3pm. Please arrive early to ensure prompt service. Bring discharge paperwork.  Contact information: [address] [place] [address] [phone]           Plan Of  Care/Follow-up recommendations:  Activity:  Activity as tolerated encourage her to be getting back to work Diet:  Regular diet or heart healthy diet Other:  Patient  states that she is agreeable to staying off of alcohol and will be following up with [facility]. She has already made contact with the liaison from [facility].  Mordecai Rasmussen, MD 08/06/2016, 12:12 PM

## 2016-08-06 NOTE — Discharge Summary (Signed)
Physician Discharge Summary Note  Patient:  Briana Davis is an 25 y.o., female MRN:  [id number] DOB:  06/02/1991 Patient phone:  [phone] (home)  Patient address:   [place] [address],  Total Time spent with patient: 35 minutes  Date of Admission:  08/03/2016 Date of Discharge: 08/06/2016  Reason for Admission:  25 year old woman with a history of bipolar disorder type II who was admitted from the medical service after recovering from an overdose of aspirin. Patient's mood had been unstable and agitated recently and she had also recently been drinking.  Principal Problem: Bipolar 2 disorder, major depressive episode [facility]) Discharge Diagnoses: Patient Active Problem List   Diagnosis Date Noted  . Drug overdose [T50.901A] 08/03/2016  . Salicylate intoxication [T39.094A] 08/03/2016  . Bipolar 2 disorder, major depressive episode ([place]) [F31.81] 08/03/2016  . Substance induced mood disorder ([place]) [F19.94] 05/05/2016  . Involuntary commitment [Z04.6] 05/05/2016  . 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 Alcohol dependence, binge pattern ([place]) [F10.20] 01/01/2016  . Attention deficit hyperactivity disorder (ADHD) [F90.9] 01/01/2016  . Bipolar 2 disorder ([place]) [F31.81] 01/01/2016  . Social anxiety disorder [F40.10] 12/15/2015  . 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 Suicidal ideation [R45.851] 12/15/2015    Past Psychiatric History: Patient has a history of bipolar disorder agitation mood lability as well as alcohol abuse with impulsive behavior in the past. Had had previous psychiatric treatment but had recently been noncompliant.  Past Medical History:  Past Medical History:  Diagnosis Date  . Aggression   . Alcohol consumption binge drinking   . Depression   . Diverticulitis   . Kidney stone   . Suicidal ideation     Past Surgical History:  Procedure Laterality Date  . TONSILLECTOMY    . WISDOM  TOOTH EXTRACTION     Family History:  Family History  Problem Relation Age of Onset  . Heart disease Mother   . Heart disease Father    Family Psychiatric  History: Positive for mood instability Social History:  History  Alcohol Use  . Yes    Comment: Sober for 20 days     History  Drug Use No    Social History   Social History  . Marital status: Single    Spouse name: N/A  . Number of children: N/A  . Years of education: N/A   Occupational History  . unemployed    Social History Main Topics  . Smoking status: Current Every Day Smoker    Packs/day: 1.00    Types: Cigarettes  . Smokeless tobacco: Never Used  . Alcohol use Yes     Comment: Sober for 20 days  . Drug use: No  . Sexual activity: Not Currently   Other Topics Concern  . None   Social History Narrative  . None    Hospital Course:  During her hospital stay the patient was continued on her Prozac and lamotrigine and dose of lamotrigine was increased to 50 mg. She has tolerated medicine well. She has participated appropriately in group therapy. She shows improved insight. She has not reported any suicidal thoughts or engage in any suicidal or dangerous behavior. Patient on evaluation today says she is feeling much better. Denies having any suicidal thoughts at all. She has met with the liaison person Mr. Marshell Levan from [place] and is agreeable to follow up there. She is tolerating medicine with the current Lamictal dose of 50 mg. Patient has been  counseled about the dangers of drinking and agrees to make sobriety part of her mental health plan. Otherwise she can be discharged today home and will follow-up with [facility].  Physical Findings: AIMS:  , ,  ,  ,    CIWA:    COWS:     Musculoskeletal: Strength & Muscle Tone: within normal limits Gait & Station: normal Patient leans: N/A  Psychiatric Specialty Exam: Physical Exam  Nursing note and vitals reviewed. Constitutional: She appears well-developed and  well-nourished.  HENT:  Head: Normocephalic and atraumatic.  Eyes: Conjunctivae are normal. Pupils are equal, round, and reactive to light.  Neck: Normal range of motion.  Cardiovascular: Regular rhythm and normal heart sounds.   Respiratory: Effort normal.  GI: Soft.  Musculoskeletal: Normal range of motion.  Neurological: She is alert.  Skin: Skin is warm and dry.  Psychiatric: She has a normal mood and affect. Her behavior is normal. Judgment and thought content normal.    Review of Systems  Constitutional: Negative.   HENT: Negative.   Eyes: Negative.   Respiratory: Negative.   Cardiovascular: Negative.   Gastrointestinal: Negative.   Musculoskeletal: Negative.   Skin: Negative.   Neurological: Negative.   Psychiatric/Behavioral: Negative for depression, hallucinations, memory loss, substance abuse and suicidal ideas. The patient is not nervous/anxious and does not have insomnia.     Blood pressure 117/71, pulse 97, temperature 98.5 F (36.9 C), resp. rate 18, height _0  (1.651 m), weight 66.2 kg (146 lb), last menstrual period 07/20/2016, SpO2 100 %.Body mass index is 24.3 kg/m.  General Appearance: Casual  Eye Contact:  Good  Speech:  Clear and Coherent  Volume:  Normal  Mood:  Euthymic  Affect:  Congruent  Thought Process:  Goal Directed  Orientation:  Full (Time, Place, and Person)  Thought Content:  Logical  Suicidal Thoughts:  No  Homicidal Thoughts:  No  Memory:  Immediate;   Good Recent;   Fair Remote;   Fair  Judgement:  Fair  Insight:  Good  Psychomotor Activity:  Normal  Concentration:  Concentration: Good  Recall:  Good  Fund of Knowledge:  Good  Language:  Good  Akathisia:  No  Handed:  Right  AIMS (if indicated):     Assets:  Communication Skills Desire for Improvement Financial Resources/Insurance Housing Physical Health Resilience Social Support  ADL's:  Intact  Cognition:  WNL  Sleep:  Number of Hours: 7.15     Have you used any  form of tobacco in the last 30 days? (Cigarettes, Smokeless Tobacco, Cigars, and/or Pipes): Yes  Has this patient used any form of tobacco in the last 30 days? (Cigarettes, Smokeless Tobacco, Cigars, and/or Pipes) Yes, Yes, A prescription for an FDA-approved tobacco cessation medication was offered at discharge and the patient refused  Blood Alcohol level:  Lab Results  Component Value Date   ETH 158 (H) 08/03/2016   ETH 282 (H) 08/31/5746    Metabolic Disorder Labs:  No results found for: HGBA1C, MPG No results found for: PROLACTIN No results found for: CHOL, TRIG, HDL, CHOLHDL, VLDL, LDLCALC  See Psychiatric Specialty Exam and Suicide Risk Assessment completed by Attending Physician prior to discharge.  Discharge destination:  Home  Is patient on multiple antipsychotic therapies at discharge:  No   Has Patient had three or more failed trials of antipsychotic monotherapy by history:  No  Recommended Plan for Multiple Antipsychotic Therapies: NA  Discharge Instructions    Diet - low sodium heart healthy  Complete by:  As directed   Increase activity slowly    Complete by:  As directed       Medication List    STOP taking these medications   amphetamine-dextroamphetamine 10 MG 24 hr capsule Commonly known as:  ADDERALL XR   hydrOXYzine 25 MG tablet Commonly known as:  ATARAX/VISTARIL     TAKE these medications     Indication  FLUoxetine 40 MG capsule Commonly known as:  PROZAC Take 1 capsule (40 mg total) by mouth daily. What changed:  additional instructions  Indication:  Manic-Depression, Major Depressive Disorder   lamoTRIgine 25 MG tablet Commonly known as:  LAMICTAL Take 2 tablets (50 mg total) by mouth daily. What changed:  how much to take  additional instructions  Indication:  Mood stabilization   nicotine 21 mg/24hr patch Commonly known as:  NICODERM CQ - dosed in mg/24 hours Place 1 patch (21 mg total) onto the skin daily.  Indication:   Nicotine Addiction   traZODone 100 MG tablet Commonly known as:  DESYREL Take 1 tablet (100 mg total) by mouth at bedtime as needed for sleep (may repeat x 1 in 1 hour if needed).  Indication:  [facility] Follow up on 08/08/2016.   Why:  Go to [facility] during walk-in hours on 08/08/2016 between 8-3pm. Please arrive early to ensure prompt service. Bring discharge paperwork.  Contact information: [address] [place] [address] [phone]           Follow-up recommendations:  Activity:  Activity as tolerated encourage return to work. Diet:  Heart healthy or regular diet Other:  Follow-up with [facility] as noted elsewhere. Remains sober.  Comments:  Patient counseled about current medications. Plan reviewed. Patient will be discharged home with follow-up at [facility]. Case reviewed with nursing and social work.  Signed: Alethia Berthold, MD 08/06/2016, 12:14 PM

## 2016-08-06 NOTE — Progress Notes (Signed)
Patient discharged home. DC instructions provided and explained, medications reviewed. Rx given. All questions answered. Pt stable at discharge. Denies SI, HI, AVH. Belongings returned. 7 day supply given.

## 2016-08-06 NOTE — [facility] Group Notes (Signed)
[facility]  08/06/2016 3:24 PM  Participation Level:  Active  Description of Group:    In this group patients will be encouraged to explore what they see as obstacles to their own wellness and recovery. They will be guided to discuss their thoughts, feelings, and behaviors related to these obstacles. The group will process together ways to cope with barriers, with attention given to specific choices patients can make. Each patient will be challenged to identify changes they are motivated to make in order to overcome their obstacles. This group will be process-oriented, with patients participating in exploration of their own experiences as well as giving and receiving support and challenge from other group members.  Therapeutic Goals: 1. Patient will identify personal and current obstacles as they relate to admission. 2. Patient will identify barriers that currently interfere with their wellness or overcoming obstacles.  3. Patient will identify feelings, thought process and behaviors related to these barriers. 4. Patient will identify two changes they are willing to make to overcome these obstacles:    Summary of Patient Progress Pt  able to meet therapeutic goals.  Group discussed qualities of resilience or persistence that could benefit recovery efforts and overcoming the obstacles faced.  She shared challenges in communication with her family and some ways she thinks she can manage that, taking medication regularly, and taking time to calm down before communicating in a way she regrets.  Therapeutic Modalities:   Cognitive Behavioral Therapy Solution Focused Therapy Motivational Interviewing Relapse Prevention Therapy  Glennon MacSara P Briana Davis, MSW, LCSW 08/06/2016, 3:24 PM

## 2016-08-09 ENCOUNTER — Ambulatory Visit: Payer: Self-pay

## 2016-09-08 ENCOUNTER — Encounter: Payer: Self-pay | Admitting: Emergency Medicine

## 2016-09-08 ENCOUNTER — Emergency Department
Admission: EM | Admit: 2016-09-08 | Discharge: 2016-09-09 | Disposition: A | Discharge status: Other Acute Inpatient Hospital | Payer: Self-pay | Attending: Emergency Medicine | Admitting: Emergency Medicine

## 2016-09-08 DIAGNOSIS — F902 Attention-deficit hyperactivity disorder, combined type: Secondary | ICD-10-CM | POA: Insufficient documentation

## 2016-09-08 DIAGNOSIS — Z91040 Latex allergy status: Secondary | ICD-10-CM | POA: Insufficient documentation

## 2016-09-08 DIAGNOSIS — F17210 Nicotine dependence, cigarettes, uncomplicated: Secondary | ICD-10-CM | POA: Insufficient documentation

## 2016-09-08 DIAGNOSIS — R45851 Suicidal ideations: Secondary | ICD-10-CM

## 2016-09-08 DIAGNOSIS — N390 Urinary tract infection, site not specified: Secondary | ICD-10-CM | POA: Insufficient documentation

## 2016-09-08 DIAGNOSIS — F329 Major depressive disorder, single episode, unspecified: Secondary | ICD-10-CM | POA: Insufficient documentation

## 2016-09-08 DIAGNOSIS — Z79899 Other long term (current) drug therapy: Secondary | ICD-10-CM | POA: Insufficient documentation

## 2016-09-08 LAB — COMPREHENSIVE METABOLIC PANEL
ALT: 24 U/L (ref 14–54)
AST: 32 U/L (ref 15–41)
Albumin: 4.4 g/dL (ref 3.5–5.0)
Alkaline Phosphatase: 70 U/L (ref 38–126)
Anion gap: 10 (ref 5–15)
BILIRUBIN TOTAL: 0.7 mg/dL (ref 0.3–1.2)
BUN: 12 mg/dL (ref 6–20)
CHLORIDE: 99 mmol/L — AB (ref 101–111)
CO2: 24 mmol/L (ref 22–32)
CREATININE: 0.6 mg/dL (ref 0.44–1.00)
Calcium: 9 mg/dL (ref 8.9–10.3)
Glucose, Bld: 84 mg/dL (ref 65–99)
POTASSIUM: 2.9 mmol/L — AB (ref 3.5–5.1)
Sodium: 133 mmol/L — ABNORMAL LOW (ref 135–145)
TOTAL PROTEIN: 7.9 g/dL (ref 6.5–8.1)

## 2016-09-08 LAB — CBC
HEMATOCRIT: 34.8 % — AB (ref 35.0–47.0)
HEMOGLOBIN: 12.3 g/dL (ref 12.0–16.0)
MCH: 32.4 pg (ref 26.0–34.0)
MCHC: 35.4 g/dL (ref 32.0–36.0)
MCV: 91.4 fL (ref 80.0–100.0)
Platelets: 490 10*3/uL — ABNORMAL HIGH (ref 150–440)
RBC: 3.81 MIL/uL (ref 3.80–5.20)
RDW: 13.1 % (ref 11.5–14.5)
WBC: 13.5 10*3/uL — AB (ref 3.6–11.0)

## 2016-09-08 LAB — URINE DRUG SCREEN, QUALITATIVE (ARMC ONLY)
AMPHETAMINES, UR SCREEN: POSITIVE — AB
BARBITURATES, UR SCREEN: NOT DETECTED
BENZODIAZEPINE, UR SCRN: POSITIVE — AB
Cannabinoid 50 Ng, Ur ~~LOC~~: POSITIVE — AB
Cocaine Metabolite,Ur ~~LOC~~: POSITIVE — AB
MDMA (Ecstasy)Ur Screen: NOT DETECTED
METHADONE SCREEN, URINE: NOT DETECTED
OPIATE, UR SCREEN: NOT DETECTED
Phencyclidine (PCP) Ur S: NOT DETECTED
TRICYCLIC, UR SCREEN: NOT DETECTED

## 2016-09-08 LAB — ETHANOL

## 2016-09-08 NOTE — ED Triage Notes (Signed)
Patient brought in by [facility] for IVC. Patient's sister took out IVC papers. Patient states that she was here a couple of weeks ago for over dosing on asa and was started back on her medications. Patient denies SI at this time.

## 2016-09-09 ENCOUNTER — Inpatient Hospital Stay
Admission: RE | Admit: 2016-09-09 | Discharge: 2016-09-13 | DRG: 881 | Disposition: A | Discharge status: Home / Self Care | Payer: No Typology Code available for payment source | Source: Intra-hospital | Attending: Psychiatry | Admitting: Psychiatry

## 2016-09-09 ENCOUNTER — Encounter: Payer: Self-pay | Admitting: Psychiatry

## 2016-09-09 DIAGNOSIS — F3181 Bipolar II disorder: Secondary | ICD-10-CM | POA: Diagnosis present

## 2016-09-09 DIAGNOSIS — F1420 Cocaine dependence, uncomplicated: Secondary | ICD-10-CM | POA: Diagnosis present

## 2016-09-09 DIAGNOSIS — N390 Urinary tract infection, site not specified: Secondary | ICD-10-CM | POA: Diagnosis present

## 2016-09-09 DIAGNOSIS — F1910 Other psychoactive substance abuse, uncomplicated: Secondary | ICD-10-CM | POA: Diagnosis present

## 2016-09-09 DIAGNOSIS — F17210 Nicotine dependence, cigarettes, uncomplicated: Secondary | ICD-10-CM | POA: Diagnosis present

## 2016-09-09 DIAGNOSIS — F1220 Cannabis dependence, uncomplicated: Secondary | ICD-10-CM | POA: Diagnosis present

## 2016-09-09 DIAGNOSIS — R45851 Suicidal ideations: Secondary | ICD-10-CM | POA: Diagnosis present

## 2016-09-09 DIAGNOSIS — Z79899 Other long term (current) drug therapy: Secondary | ICD-10-CM

## 2016-09-09 DIAGNOSIS — F1020 Alcohol dependence, uncomplicated: Secondary | ICD-10-CM | POA: Diagnosis present

## 2016-09-09 DIAGNOSIS — F1994 Other psychoactive substance use, unspecified with psychoactive substance-induced mood disorder: Secondary | ICD-10-CM | POA: Diagnosis present

## 2016-09-09 DIAGNOSIS — G4700 Insomnia, unspecified: Secondary | ICD-10-CM | POA: Diagnosis present

## 2016-09-09 DIAGNOSIS — B9620 Unspecified Escherichia coli [E. coli] as the cause of diseases classified elsewhere: Secondary | ICD-10-CM | POA: Diagnosis present

## 2016-09-09 DIAGNOSIS — Z046 Encounter for general psychiatric examination, requested by authority: Secondary | ICD-10-CM

## 2016-09-09 DIAGNOSIS — F909 Attention-deficit hyperactivity disorder, unspecified type: Secondary | ICD-10-CM | POA: Diagnosis present

## 2016-09-09 DIAGNOSIS — F1310 Sedative, hypnotic or anxiolytic abuse, uncomplicated: Secondary | ICD-10-CM | POA: Diagnosis present

## 2016-09-09 DIAGNOSIS — Z8249 Family history of ischemic heart disease and other diseases of the circulatory system: Secondary | ICD-10-CM | POA: Diagnosis not present

## 2016-09-09 DIAGNOSIS — F1010 Alcohol abuse, uncomplicated: Secondary | ICD-10-CM | POA: Diagnosis present

## 2016-09-09 DIAGNOSIS — F329 Major depressive disorder, single episode, unspecified: Principal | ICD-10-CM | POA: Diagnosis present

## 2016-09-09 DIAGNOSIS — F17200 Nicotine dependence, unspecified, uncomplicated: Secondary | ICD-10-CM | POA: Diagnosis present

## 2016-09-09 DIAGNOSIS — F319 Bipolar disorder, unspecified: Secondary | ICD-10-CM | POA: Diagnosis present

## 2016-09-09 LAB — URINALYSIS COMPLETE WITH MICROSCOPIC (ARMC ONLY)
BILIRUBIN URINE: NEGATIVE
GLUCOSE, UA: NEGATIVE mg/dL
LEUKOCYTES UA: NEGATIVE
NITRITE: POSITIVE — AB
PH: 5 (ref 5.0–8.0)
Protein, ur: 100 mg/dL — AB
Specific Gravity, Urine: 1.025 (ref 1.005–1.030)

## 2016-09-09 LAB — PREGNANCY, URINE: PREG TEST UR: NEGATIVE

## 2016-09-09 LAB — SALICYLATE LEVEL: Salicylate Lvl: <4 mg/dL (ref 2.8–30.0)

## 2016-09-09 LAB — ACETAMINOPHEN LEVEL: Acetaminophen (Tylenol), Serum: <10 ug/mL — ABNORMAL LOW (ref 10–30)

## 2016-09-09 MED ORDER — CHLORDIAZEPOXIDE HCL 25 MG PO CAPS: 50.0000 mg | ORAL_CAPSULE | Freq: Three times a day (TID) | ORAL | Status: DC | PRN

## 2016-09-09 MED ORDER — POTASSIUM CHLORIDE CRYS ER 20 MEQ PO TBCR
40.0000 meq | EXTENDED_RELEASE_TABLET | Freq: Once | ORAL | Status: AC
  Administered 2016-09-09: 40 meq via ORAL
  Filled 2016-09-09: qty 2

## 2016-09-09 MED ORDER — NICOTINE 21 MG/24HR TD PT24
21.0000 mg | MEDICATED_PATCH | Freq: Every day | TRANSDERMAL | Status: DC
  Filled 2016-09-09 (×4): qty 1

## 2016-09-09 MED ORDER — NITROFURANTOIN MONOHYD MACRO 100 MG PO CAPS
100.0000 mg | ORAL_CAPSULE | Freq: Two times a day (BID) | ORAL | Status: DC
  Administered 2016-09-10 – 2016-09-11 (×3): 100 mg via ORAL
  Filled 2016-09-09 (×4): qty 1

## 2016-09-09 MED ORDER — ACETAMINOPHEN 325 MG PO TABS
ORAL_TABLET | ORAL | Status: AC
  Administered 2016-09-09: 650 mg
  Filled 2016-09-09: qty 2

## 2016-09-09 MED ORDER — NITROFURANTOIN MONOHYD MACRO 100 MG PO CAPS
100.0000 mg | ORAL_CAPSULE | Freq: Two times a day (BID) | ORAL | Status: DC
  Administered 2016-09-09 (×2): 100 mg via ORAL
  Filled 2016-09-09 (×2): qty 1

## 2016-09-09 MED ORDER — MAGNESIUM HYDROXIDE 400 MG/5ML PO SUSP: 30.0000 mL | Freq: Every day | ORAL | Status: DC | PRN

## 2016-09-09 MED ORDER — ALUM & MAG HYDROXIDE-SIMETH 200-200-20 MG/5ML PO SUSP: 30.0000 mL | ORAL | Status: DC | PRN

## 2016-09-09 MED ORDER — ACETAMINOPHEN 325 MG PO TABS: 650.0000 mg | ORAL_TABLET | Freq: Four times a day (QID) | ORAL | Status: DC | PRN

## 2016-09-09 MED ORDER — TRAZODONE HCL 100 MG PO TABS
100.0000 mg | ORAL_TABLET | Freq: Every evening | ORAL | Status: DC | PRN
  Administered 2016-09-09 – 2016-09-12 (×4): 100 mg via ORAL
  Filled 2016-09-09 (×4): qty 1

## 2016-09-09 MED ORDER — ACETAMINOPHEN 325 MG PO TABS: 650.0000 mg | ORAL_TABLET | Freq: Once | ORAL | Status: DC

## 2016-09-09 NOTE — ED Notes (Signed)

## 2016-09-09 NOTE — ED Notes (Signed)
BEHAVIORAL HEALTH ROUNDING Patient sleeping: Yes.   Patient alert and oriented: not applicable SLEEPING Behavior appropriate: Yes.  ; If no, describe: SLEEPING Nutrition and fluids offered: No SLEEPING Toileting and hygiene offered: NoSLEEPING Sitter present: not applicable, Q 15 min safety rounds and observation. Law enforcement present: Yes ODS 

## 2016-09-09 NOTE — ED Notes (Signed)
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 THey wanted to know what drugs were in her urine I informed them I could not share that information she just gave me permission to talk to them about the hotel  Situation and her belongings. They are very worried she will take her life when she is intoxicated

## 2016-09-09 NOTE — ED Notes (Signed)

## 2016-09-09 NOTE — ED Notes (Signed)
Called unit to inquire about giving report and was informed that admission nurse would call when she was ready for the next admission

## 2016-09-09 NOTE — ED Notes (Signed)

## 2016-09-09 NOTE — Tx Team (Signed)
Initial Treatment Plan 09/09/2016 11:55 PM Briana Davis ONG:[id number]    PATIENT STRESSORS: Marital or family conflict Occupational concerns Substance abuse   PATIENT STRENGTHS: Average or above average intelligence Capable of independent living Physical Health Supportive family/friends   PATIENT IDENTIFIED PROBLEMS: Substance abuse  Depression  "Figuring out a place to stay."  "Talk to the doctor about my medications."               DISCHARGE CRITERIA:  Adequate post-discharge living arrangements Improved stabilization in mood, thinking, and/or behavior Motivation to continue treatment in a less acute level of care Need for constant or close observation no longer present Reduction of life-threatening or endangering symptoms to within safe limits Verbal commitment to aftercare and medication compliance  PRELIMINARY DISCHARGE PLAN: Attend aftercare/continuing care group Attend 12-step recovery group Outpatient therapy  PATIENT/FAMILY INVOLVEMENT: This treatment plan has been presented to and reviewed with the patient, Briana Davis, and/or family member.  The patient and family have been given the opportunity to ask questions and make suggestions.  Cristela Felt, RN 09/09/2016, 11:55 PM

## 2016-09-09 NOTE — BH Assessment (Signed)
Referral information for Psych Inpatient Treatment have been faxed to;    [facility] ([phone]/[phone]) [phone] ext. [phone]   [facility] ([phone]),    [facility] ([phone] or [phone]),    [facility] ([phone]),    [facility] ([phone]),    [facility] ([phone]),    [facility] ([phone]). 

## 2016-09-09 NOTE — ED Notes (Signed)
Report called to Kim, RN in ED [facility].  

## 2016-09-09 NOTE — ED Notes (Signed)
BEHAVIORAL HEALTH ROUNDING  Patient sleeping: No.  Patient alert and oriented: yes  Behavior appropriate: Yes. ; If no, describe:  Nutrition and fluids offered: Yes  Toileting and hygiene offered: Yes  Sitter present: not applicable, Q 15 min safety rounds and observation.  Law enforcement present: Yes ODS  

## 2016-09-09 NOTE — BH Assessment (Signed)
Tele Assessment Note   Briana Davis is an 25 y.o. female that denies SI/HI/Psychosis/Substance Abuse.    Patient reports that she does not want to kill herself.  Patient report that she she did not tell her sister that she was going to kill herself.   Patient denies prior inpatient psychiatric hospitalization.  However, per  chart review in epic that patient has been to [facility] for prior suicide attempts as well as substance abuse.    Patient reports that she lives with her sister and she attends [employer].      Diagnosis: Major Depressive Disorder   Past Medical History:  Past Medical History:  Diagnosis Date  . Aggression   . Alcohol consumption binge drinking   . Depression   . Diverticulitis   . Kidney stone   . Suicidal ideation     Past Surgical History:  Procedure Laterality Date  . TONSILLECTOMY    . WISDOM TOOTH EXTRACTION      Family History:  Family History  Problem Relation Age of Onset  . Heart disease Mother   . Heart disease Father     Social History:  reports that she has been smoking Cigarettes.  She has been smoking about 1.00 pack per day. She has never used smokeless tobacco. She reports that she drinks alcohol. Her drug history is not on file.  Additional Social History:  Alcohol / Drug Use History of alcohol / drug use?: No history of alcohol / drug abuse  CIWA: CIWA-Ar BP: 130/90 Pulse Rate: (!) 128 COWS:    PATIENT STRENGTHS: (choose at least two) Average or above average intelligence Capable of independent living Communication skills Physical Health  Allergies:  Allergies  Allergen Reactions  . Latex Other (See Comments)    Only in vaginal area  . Amoxicillin Rash    Has patient had a PCN reaction causing immediate rash, facial/tongue/throat swelling, SOB or lightheadedness with hypotension: Yes Has patient had a PCN reaction causing severe rash involving mucus membranes or skin necrosis: Yes Has patient had a PCN reaction that  required hospitalization Yes Has patient had a PCN reaction occurring within the last 10 years: No If all of the above answers are "NO", then may proceed with Cephalosporin use.     Home Medications:  (Not in a hospital admission)  OB/GYN Status:  Patient's last menstrual period was 08/09/2016 (approximate).  General Assessment Data Location of Assessment: [facility] ED TTS Assessment: In system Is this a Tele or Face-to-Face Assessment?: Tele Assessment Is this an Initial Assessment or a Re-assessment for this encounter?: Initial Assessment Marital status: Single Maiden name: NA Is patient pregnant?: No Pregnancy Status: No Living Arrangements:  (Sister ) Can pt return to current living arrangement?: Yes Admission Status: Involuntary Is patient capable of signing voluntary admission?: No Referral Source: Self/Family/Friend Insurance type: self Pay   Medical Screening Exam [facility] Walk-in ONLY) Medical Exam completed:  (NA)  Crisis Care Plan Living Arrangements:  (Sister ) Legal Guardian:  (NA) Name of Psychiatrist: [facility] Name of Therapist: [facility]  Education Status Is patient currently in school?: No Current Grade: NA Highest grade of school patient has completed: NA Name of school: NA Contact person: NA  Risk to self with the past 6 months Suicidal Ideation: No Has patient been a risk to self within the past 6 months prior to admission? : No Suicidal Intent: No Has patient had any suicidal intent within the past 6 months prior to admission? : No Is patient at  risk for suicide?: No Suicidal Plan?: No Has patient had any suicidal plan within the past 6 months prior to admission? : No Access to Means: No What has been your use of drugs/alcohol within the last 12 months?: Npme Reprted Previous Attempts/Gestures: Yes How many times?: 2 Other Self Harm Risks: None Reported Triggers for Past Attempts: None known Intentional Self Injurious Behavior: None Recent stressful life  event(s): Conflict (Comment) (Strained relationship with her sister) Persecutory voices/beliefs?: No Depression: Yes Depression Symptoms: Despondent Substance abuse history and/or treatment for substance abuse?: No Suicide prevention information given to non-admitted patients: Not applicable  Risk to Others within the past 6 months Does patient have any lifetime risk of violence toward others beyond the six months prior to admission? : No Thoughts of Harm to Others: No Current Homicidal Plan: No Access to Homicidal Means: No Assessment of Violence: None Noted Violent Behavior Description: NA Does patient have access to weapons?: No Criminal Charges Pending?: No Does patient have a court date: No Is patient on probation?: No  Psychosis Hallucinations: None noted Delusions: None noted  Mental Status Report Appearance/Hygiene: Layered clothes Eye Contact: Good Motor Activity: Freedom of movement Speech: Logical/coherent Level of Consciousness: Alert Mood: Depressed Affect: Depressed Anxiety Level: None Thought Processes: Coherent, Relevant Judgement: Unimpaired Orientation: Person, Place, Time, Situation  Cognitive Functioning Concentration: Normal Memory: Recent Intact, Remote Intact IQ: Above Average Insight: Fair Impulse Control: Fair  ADLScreening [facility] Assessment Services) Patient's cognitive ability adequate to safely complete daily activities?: Yes Patient able to express need for assistance with ADLs?: Yes Independently performs ADLs?: Yes (appropriate for developmental age)  Prior Inpatient Therapy Prior Inpatient Therapy: No Prior Therapy Dates: NA Prior Therapy Facilty/Provider(s): NA Reason for Treatment: NA  Prior Outpatient Therapy Prior Outpatient Therapy: Yes Prior Therapy Dates: Unable to remember the dates Prior Therapy Facilty/Provider(s): Unablet to remember the name  Reason for Treatment: Med Mgt n Does patient have an ACCT team?: No Does  patient have Intensive In-House Services?  : No Does patient have [facility] services? : No Does patient have P4CC services?: No  ADL Screening (condition at time of admission) Patient's cognitive ability adequate to safely complete daily activities?: Yes Is the patient deaf or have difficulty hearing?: No Does the patient have difficulty seeing, even when wearing glasses/contacts?: No Does the patient have difficulty concentrating, remembering, or making decisions?: No Patient able to express need for assistance with ADLs?: Yes Does the patient have difficulty dressing or bathing?: No Independently performs ADLs?: Yes (appropriate for developmental age) Does the patient have difficulty walking or climbing stairs?: No Weakness of Legs: None Weakness of Arms/Hands: None       Abuse/Neglect Assessment (Assessment to be complete while patient is alone) Physical Abuse: Denies Verbal Abuse: Denies Sexual Abuse: Denies Exploitation of patient/patient's resources: Denies Self-Neglect: Denies Values / Beliefs Cultural Requests During Hospitalization: None Spiritual Requests During Hospitalization: None Consults Spiritual Care Consult Needed: No Social Work Consult Needed: No Merchant navy officerAdvance Directives (For Healthcare) Does patient have an advance directive?: No Would patient like information on creating an advanced directive?: No - patient declined information    Additional Information 1:1 In Past 12 Months?: No CIRT Risk: No Does patient have medical clearance?: No     Disposition: Pending psych disposition.  Disposition Initial Assessment Completed for this Encounter: Yes Disposition of Patient: Inpatient treatment program  Linton RumpStevenson, Castulo Scarpelli [place] 09/09/2016 5:33 AM

## 2016-09-09 NOTE — BH Assessment (Signed)
[facility] by Dr. Milus MallickHerandez.  Attending Physician will be Dr. Jennet MaduroPucilowska.   Patient has been [facility] to room 316[facility] Cliff P.   Intake Paper Work has been signed and placed on patient chart.  ER staff is aware of the admission Christen Bame(Ronnie, ER Sect.; Dr. Darnelle CatalanMalinda, ER MD; Windell Mouldinguth, Patient's Nurse & Nedra HaiLee, Patient Access).

## 2016-09-09 NOTE — ED Notes (Signed)
Pt c/o of headache of 7 , notified ed DR order received

## 2016-09-09 NOTE — ED Provider Notes (Signed)
[facility] Emergency Department Provider Note  ____________________________________________   First MD Initiated Contact with Patient 09/08/16 2300     (approximate)  I have reviewed the triage vital signs and the nursing notes.   HISTORY  Chief Complaint No chief complaint on file.   HPI Briana Davis is a 25 y.o. female with a history of suicidal ideation as well as depression is present to the emergency department under commitment because of suicidal ideation. Her commitment papers read that she told her sister that she was going to kill herself. The patient denies this and says that the sister was just angry because she did not know where she was. The patient does have a recent suicide attempt of an overdose.   Past Medical History:  Diagnosis Date  . Aggression   . Alcohol consumption binge drinking   . Depression   . Diverticulitis   . Kidney stone   . Suicidal ideation     Patient Active Problem List   Diagnosis Date Noted  . Drug overdose 08/03/2016  . Salicylate intoxication 08/03/2016  . Bipolar 2 disorder, major depressive episode (HCC) 08/03/2016  . Substance induced mood disorder (HCC) 05/05/2016  . Involuntary commitment 05/05/2016  . Attention deficit hyperactivity disorder (ADHD), combined type   . Alcohol dependence, binge pattern (HCC) 01/01/2016  . Attention deficit hyperactivity disorder (ADHD) 01/01/2016  . Bipolar 2 disorder (HCC) 01/01/2016  . Social anxiety disorder 12/15/2015  . Mild alcohol abuse in early remission 12/15/2015  . Suicidal ideation 12/15/2015    Past Surgical History:  Procedure Laterality Date  . TONSILLECTOMY    . WISDOM TOOTH EXTRACTION      Prior to Admission medications   Medication Sig Start Date End Date Taking? Authorizing Provider  FLUoxetine (PROZAC) 40 MG capsule Take 1 capsule (40 mg total) by mouth daily. 08/06/16   Audery Amel, MD  lamoTRIgine (LAMICTAL) 25 MG tablet Take 2  tablets (50 mg total) by mouth daily. 08/06/16   Audery Amel, MD  nicotine (NICODERM CQ - DOSED IN MG/24 HOURS) 21 mg/24hr patch Place 1 patch (21 mg total) onto the skin daily. 08/06/16   Audery Amel, MD  traZODone (DESYREL) 100 MG tablet Take 1 tablet (100 mg total) by mouth at bedtime as needed for sleep (may repeat x 1 in 1 hour if needed). 08/06/16   Audery Amel, MD    Allergies Latex and Amoxicillin  Family History  Problem Relation Age of Onset  . Heart disease Mother   . Heart disease Father     Social History Social History  Substance Use Topics  . Smoking status: Current Every Day Smoker    Packs/day: 1.00    Types: Cigarettes  . Smokeless tobacco: Never Used  . Alcohol use Yes    Review of Systems Constitutional: No fever/chills Eyes: No visual changes. ENT: No sore throat. Cardiovascular: Denies chest pain. Respiratory: Denies shortness of breath. Gastrointestinal: No abdominal pain.  No nausea, no vomiting.  No diarrhea.  No constipation. Genitourinary: Negative for dysuria. Musculoskeletal: Negative for back pain. Skin: Negative for rash. Neurological: Negative for headaches, focal weakness or numbness.  10-point ROS otherwise negative.  ____________________________________________   PHYSICAL EXAM:  VITAL SIGNS: ED Triage Vitals [09/08/16 2149]  Enc Vitals Group     BP 130/90     Pulse Rate (!) 128     Resp 18     Temp 98.5 F (36.9 C)  Temp Source Oral     SpO2 99 %     Weight 146 lb (66.2 kg)     Height 5\' 5"  (1.651 m)     Head Circumference      Peak Flow      Pain Score      Pain Loc      Pain Edu?      Excl. in GC?     Constitutional: Alert and oriented. Well appearing and in no acute distress. Eyes: Conjunctivae are normal. PERRL. EOMI. Head: Atraumatic. Nose: No congestion/rhinnorhea. Mouth/Throat: Mucous membranes are moist.   Neck: No stridor.   Cardiovascular: Normal rate, regular rhythm. Grossly normal heart sounds.     Respiratory: Normal respiratory effort.  No retractions. Lungs CTAB. Gastrointestinal: Soft and nontender. No distention.  Musculoskeletal: No lower extremity tenderness nor edema.  No joint effusions. Neurologic:  Normal speech and language. No gross focal neurologic deficits are appreciated. No gait instability. Skin:  Skin is warm, dry and intact. No rash noted. Psychiatric: Mood and affect are normal. Speech and behavior are normal.  ____________________________________________   LABS (all labs ordered are listed, but only abnormal results are displayed)  Labs Reviewed  COMPREHENSIVE METABOLIC PANEL - Abnormal; Notable for the following:       Result Value   Sodium 133 (*)    Potassium 2.9 (*)    Chloride 99 (*)    All other components within normal limits  CBC - Abnormal; Notable for the following:    WBC 13.5 (*)    HCT 34.8 (*)    Platelets 490 (*)    All other components within normal limits  URINE DRUG SCREEN, QUALITATIVE ([facility] ONLY) - Abnormal; Notable for the following:    Amphetamines, Ur Screen POSITIVE (*)    Cocaine Metabolite,Ur [place] POSITIVE (*)    Cannabinoid 50 Ng, Ur [place] POSITIVE (*)    Benzodiazepine, Ur Scrn POSITIVE (*)    All other components within normal limits  ACETAMINOPHEN LEVEL - Abnormal; Notable for the following:    Acetaminophen (Tylenol), Serum <10 (*)    All other components within normal limits  URINALYSIS COMPLETEWITH MICROSCOPIC ([facility] ONLY) - Abnormal; Notable for the following:    Color, Urine AMBER (*)    APPearance HAZY (*)    Ketones, ur 2+ (*)    Hgb urine dipstick 3+ (*)    Protein, ur 100 (*)    Nitrite POSITIVE (*)    Bacteria, UA RARE (*)    Squamous Epithelial / LPF 6-30 (*)    All other components within normal limits  URINE CULTURE  ETHANOL  SALICYLATE LEVEL  PREGNANCY, URINE    ____________________________________________  EKG   ____________________________________________  RADIOLOGY   ____________________________________________   PROCEDURES  Procedure(s) performed:   Procedures  Critical Care performed:   ____________________________________________   INITIAL IMPRESSION / ASSESSMENT AND PLAN / ED COURSE  Pertinent labs & imaging results that were available during my care of the patient were reviewed by me and considered in my medical decision making (see chart for details).  2 uphold IVC. Patient to speak with psychiatry.  Clinical Course     ____________________________________________   FINAL CLINICAL IMPRESSION(S) / ED DIAGNOSES  Final diagnoses:  Suicidal ideation  Urinary tract infection without hematuria, site unspecified      NEW MEDICATIONS STARTED DURING THIS VISIT:  New Prescriptions   No medications on file     Note:  This document was prepared using Dragon voice recognition software  and may include unintentional dictation errors.    Myrna Blazeravid Matthew Brayley Mackowiak, MD 09/09/16 [phone]

## 2016-09-09 NOTE — ED Notes (Signed)
IVC/SOC completed/Recommend re-evaluation when family available

## 2016-09-09 NOTE — ED Notes (Signed)
Pt. To BHU from ED ambulatory without difficulty, to room  [facility]. [facility]. Pt. Is alert and oriented, warm and dry in no distress. Pt. Denies SI, HI, and AVH. Pt. Calm and cooperative. Pt. Made aware of security cameras and Q15 minute rounds. Pt. Encouraged to let Nursing staff know of any concerns or needs.

## 2016-09-09 NOTE — ED Notes (Signed)
Pt was using phone to speak to mother and pt became very agitated and yelling and cursing at her mom, pt informed to calm down or phone call would be ended, pt did comply

## 2016-09-09 NOTE — ED Notes (Signed)
Patient is to be transferred to [facility]. [facility] Patient made aware of transfer and is in agreement.

## 2016-09-09 NOTE — ED Notes (Signed)
Pt. Alert and oriented, warm and dry, in no distress. Pt. Denies SI, HI, and AVH. Pt. Encouraged to let nursing staff know of any concerns or needs. 

## 2016-09-09 NOTE — ED Notes (Signed)
Pt talking to SOC 

## 2016-09-09 NOTE — ED Notes (Signed)
Pt talking to TTS via computer at this time.

## 2016-09-09 NOTE — ED Notes (Signed)
Pt was informed that she would be admitted to this hospitals adm unit and she states " why " I have explained previously  in detail the whole ivc process etc ,she has no insight into her addiction  or depression problem .

## 2016-09-10 ENCOUNTER — Encounter: Payer: Self-pay | Admitting: Psychiatry

## 2016-09-10 DIAGNOSIS — F1220 Cannabis dependence, uncomplicated: Secondary | ICD-10-CM | POA: Diagnosis present

## 2016-09-10 DIAGNOSIS — F3181 Bipolar II disorder: Secondary | ICD-10-CM

## 2016-09-10 DIAGNOSIS — F1420 Cocaine dependence, uncomplicated: Secondary | ICD-10-CM | POA: Diagnosis present

## 2016-09-10 DIAGNOSIS — F1310 Sedative, hypnotic or anxiolytic abuse, uncomplicated: Secondary | ICD-10-CM | POA: Diagnosis present

## 2016-09-10 DIAGNOSIS — F17200 Nicotine dependence, unspecified, uncomplicated: Secondary | ICD-10-CM | POA: Diagnosis present

## 2016-09-10 MED ORDER — CHLORDIAZEPOXIDE HCL 25 MG PO CAPS
25.0000 mg | ORAL_CAPSULE | Freq: Three times a day (TID) | ORAL | Status: DC | PRN
  Administered 2016-09-11: 25 mg via ORAL
  Filled 2016-09-10: qty 1

## 2016-09-10 MED ORDER — FLUOXETINE HCL 20 MG PO CAPS
40.0000 mg | ORAL_CAPSULE | Freq: Every day | ORAL | Status: DC
  Administered 2016-09-10 – 2016-09-13 (×4): 40 mg via ORAL
  Filled 2016-09-10 (×4): qty 2

## 2016-09-10 MED ORDER — LAMOTRIGINE 100 MG PO TABS
100.0000 mg | ORAL_TABLET | Freq: Every day | ORAL | Status: DC
Start: 2016-09-10 — End: 2016-09-13
  Administered 2016-09-10 – 2016-09-12 (×3): 100 mg via ORAL
  Filled 2016-09-10 (×3): qty 1

## 2016-09-10 NOTE — [facility] Suicide Risk Assessment (Signed)
[facility] INPATIENT:  Family/Significant Other Suicide Prevention Education  Suicide Prevention Education:  Education Completed; mother,  Briana Davis ph#: [phone] has been identified by the patient as the family member/significant other with whom the patient will be residing, and identified as the person(s) who will aid the patient in the event of a mental health crisis (suicidal ideations/suicide attempt).  With written consent from the patient, the family member/significant other has been provided the following suicide prevention education, prior to the and/or following the discharge of the patient. Mother stated that patient is not able to return home with her or daughter because of her behaviors. Mother interested in patient going to an [facility] at this time. CSW will provide patient with list of [address] [place].  The suicide prevention education provided includes the following:  Suicide risk factors  Suicide prevention and interventions  National Suicide Hotline telephone number  [facility] assessment telephone number  [facility] 911  [facility] and/or Residential Mobile Crisis Unit telephone number  Request made of family/significant other to:  Remove weapons (e.g., guns, rifles, knives), all items previously/currently identified as safety concern.    Remove drugs/medications (over-the-counter, prescriptions, illicit drugs), all items previously/currently identified as a safety concern.  The family member/significant other verbalizes understanding of the suicide prevention education information provided.  The family member/significant other agrees to remove the items of safety concern listed above.  Lynden OxfordKadijah R Rasean Davis, MSW, LCSW-A 09/10/2016, 1:26 PM

## 2016-09-10 NOTE — H&P (Signed)
Psychiatric Admission Assessment Adult  Patient Identification: Briana Davis MRN:  [id number] Date of Evaluation:  09/10/2016 Chief Complaint:  Depression Principal Diagnosis: Bipolar 2 disorder, major depressive episode ([place]) Diagnosis:   Patient Active Problem List   Diagnosis Date Noted  . Tobacco use disorder [F17.200] 09/10/2016  . Cannabis use disorder, moderate, dependence ([place]) [F12.20] 09/10/2016  . Cocaine use disorder, moderate, dependence ([place]) [F14.20] 09/10/2016  . Sedative, hypnotic or anxiolytic use disorder, mild, abuse [F13.10] 09/10/2016  . Drug overdose [T50.901A] 08/03/2016  . Salicylate intoxication [T39.091A] 08/03/2016  . Bipolar 2 disorder, major depressive episode ([place]) [F31.81] 08/03/2016  . Substance induced mood disorder ([place]) [F19.94] 05/05/2016  . Involuntary commitment [Z04.6] 05/05/2016  . 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 Alcohol dependence, binge pattern ([place]) [F10.20] 01/01/2016  . Attention deficit hyperactivity disorder (ADHD) [F90.9] 01/01/2016  . Social anxiety disorder [F40.10] 12/15/2015  . 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 Suicidal ideation [R45.851] 12/15/2015   History of Present Illness:   Identifying data. Ms. Niehoff is a 25 year old female with a history of bipolar disorder.   Chief complaint. "I was playing by the rules."  History of present illness. The patient has a history of bipolar disorder and substance. She was hospitalized recently at [facility] after aspirin overdose. Ffollowing discharge she continued to work with Dr. Jamse Arn at [facility] who prescribes Prozac and Lamictal. The patient was petitioned by her sister who reported suicidal threats. The patient herself denies having thoughts of hurting herself or others. She admits that she has been using drugs recently and her family was extremely worried. She denies any symptoms of anxiety,  depression, or psychosis but is unsure if she will be able to return home to live with her mother. Apparently her sister was trying to reach her by phone on the night of admission and the patient did not want to talk to as she was using. The patient has a history of cutting but did not try to injure herself recently. She claims to be compliant with psychotropic medications. She does report a history of anxiety and hypomanic symptoms including decreased sleep with increased goal-directed behavior and some mild euphoria along with racing thoughts and increased anxiety but no hyperreligious thoughts or hypersexual behavior. She denies any spending sprees or gambling. She does have a history of binge drinking alcohol and would drink 7-8 beers for several nights in a row and then not drink for 2 or 3 weeks.   Past psychiatric history. The patient did have 2 prior inpatient psychiatric hospitalization in January 2017 at [facility] and recently at [facility]. She has been followed at [facility] in the past but has been noncompliant with psychotropic medications and follow-up visits. She does have a history of cutting in the past as well as an overdose. She has had trials of Adderall, Zoloft, Wellbutrin, Depakote and is now on a combination of Prozac and Lamictal but is been noncompliant with medication. She denies any residential rehabilitation and says that she would like to go to AA/NA.  Family psychiatric history. Patient reports that her mother and sister also struggled with panic disorder, depression and anxiety  Substance abuse history. The patient has a history of binge drinking alcohol. She reports that she drinks several times a week, [place] 7 or 8 beers each time. She does have a history of a DUI in the past as well. She tried marijuana but did not like the  drug. She denies any cocaine, opioid, or stimulant use. No benzodiazepine abuse that she reports. She does smoke one pack of cigarettes per day and  has been smoking since her teens  Social history. The patient was born and raised in [place] by her mom primarily as a 20 years old. She has one sister. She denies any history of any physical or sexual abuse. The patient dropped out of high school in the 11th grade and is currently working on getting her GED. 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 She does hope to eventually go to college for nursing. She has never been married and has no children and is not currently in a relationship. The patient has a history of a DUI.   Total Time spent with patient: 1 hour  Is the patient at risk to self? Yes.    Has the patient been a risk to self in the past 6 months? Yes.    Has the patient been a risk to self within the distant past? No.  Is the patient a risk to others? No.  Has the patient been a risk to others in the past 6 months? No.  Has the patient been a risk to others within the distant past? No.   Prior Inpatient Therapy:   Prior Outpatient Therapy:    Alcohol Screening: 1. How often do you have a drink containing alcohol?: 4 or more times a week 2. How many drinks containing alcohol do you have on a typical day when you are drinking?: 3 or 4 3. How often do you have six or more drinks on one occasion?: Daily or almost daily Preliminary Score: 5 4. How often during the last year have you found that you were not able to stop drinking once you had started?: Never 5. How often during the last year have you failed to do what was normally expected from you becasue of drinking?: Never 6. How often during the last year have you needed a first drink in the morning to get yourself going after a heavy drinking session?: Never 7. How often during the last year have you had a feeling of guilt of remorse after drinking?: Never 8. How often during the last year have you been unable to remember what happened the night before because you had been drinking?: Less than monthly 9. Have you  or someone else been injured as a result of your drinking?: Yes, but not in the last year 10. Has a relative or friend or a doctor or another health worker been concerned about your drinking or suggested you cut down?: Yes, during the last year Alcohol Use Disorder Identification Test Final Score (AUDIT): 16 Brief Intervention: Yes Substance Abuse History in the last 12 months:  Yes.   Consequences of Substance Abuse: Negative Previous Psychotropic Medications: Yes  Psychological Evaluations: No  Past Medical History:  Past Medical History:  Diagnosis Date  . Aggression   . Alcohol consumption binge drinking   . Depression   . Diverticulitis   . Kidney stone   . Suicidal ideation     Past Surgical History:  Procedure Laterality Date  . TONSILLECTOMY    . WISDOM TOOTH EXTRACTION     Family History:  Family History  Problem Relation Age of Onset  . Heart disease Mother   . Heart disease Father    Tobacco Screening: Have you used any form of tobacco in the last 30 days? (Cigarettes, Smokeless Tobacco, Cigars, and/or  Pipes): Yes Tobacco use, Select all that apply: 5 or more cigarettes per day Are you interested in Tobacco Cessation Medications?: Yes, will notify MD for an order Counseled patient on smoking cessation including recognizing danger situations, developing coping skills and basic information about quitting provided: Refused/Declined practical counseling Social History:  History  Alcohol Use  . Yes     History  Drug Use  . Types: Cocaine, Marijuana    Additional Social History:      Pain Medications: denies Prescriptions: see PTA meds Over the Counter: denies History of alcohol / drug use?: Yes                    Allergies:   Allergies  Allergen Reactions  . Latex Other (See Comments)    Only in vaginal area  . Amoxicillin Rash    Has patient had a PCN reaction causing immediate rash, facial/tongue/throat swelling, SOB or lightheadedness with  hypotension: Yes Has patient had a PCN reaction causing severe rash involving mucus membranes or skin necrosis: Yes Has patient had a PCN reaction that required hospitalization Yes Has patient had a PCN reaction occurring within the last 10 years: No If all of the above answers are "NO", then may proceed with Cephalosporin use.    Lab Results:  Results for orders placed or performed during the hospital encounter of 09/08/16 (from the past 48 hour(s))  Comprehensive metabolic panel     Status: Abnormal   Collection Time: 09/08/16 10:01 PM  Result Value Ref Range   Sodium 133 (L) 135 - 145 mmol/L   Potassium 2.9 (L) 3.5 - 5.1 mmol/L   Chloride 99 (L) 101 - 111 mmol/L   CO2 24 22 - 32 mmol/L   Glucose, Bld 84 65 - 99 mg/dL   BUN 12 6 - 20 mg/dL   Creatinine, Ser 0.60 0.44 - 1.00 mg/dL   Calcium 9.0 8.9 - 10.3 mg/dL   Total Protein 7.9 6.5 - 8.1 g/dL   Albumin 4.4 3.5 - 5.0 g/dL   AST 32 15 - 41 U/L   ALT 24 14 - 54 U/L   Alkaline Phosphatase 70 38 - 126 U/L   Total Bilirubin 0.7 0.3 - 1.2 mg/dL   GFR calc non Af Amer >60 >60 mL/min   GFR calc Af Amer >60 >60 mL/min    Comment: (NOTE) The eGFR has been calculated using the CKD EPI equation. This calculation has not been validated in all clinical situations. eGFR's persistently <60 mL/min signify possible Chronic Kidney Disease.    Anion gap 10 5 - 15  Ethanol     Status: None   Collection Time: 09/08/16 10:01 PM  Result Value Ref Range   Alcohol, Ethyl (B) <5 <5 mg/dL    Comment:        LOWEST DETECTABLE LIMIT FOR SERUM ALCOHOL IS 5 mg/dL FOR MEDICAL PURPOSES ONLY   cbc     Status: Abnormal   Collection Time: 09/08/16 10:01 PM  Result Value Ref Range   WBC 13.5 (H) 3.6 - 11.0 K/uL   RBC 3.81 3.80 - 5.20 MIL/uL   Hemoglobin 12.3 12.0 - 16.0 g/dL   HCT 34.8 (L) 35.0 - 47.0 %   MCV 91.4 80.0 - 100.0 fL   MCH 32.4 26.0 - 34.0 pg   MCHC 35.4 32.0 - 36.0 g/dL   RDW 13.1 11.5 - 14.5 %   Platelets 490 (H) 150 - 440 K/uL   Acetaminophen level  Status: Abnormal   Collection Time: 09/08/16 10:01 PM  Result Value Ref Range   Acetaminophen (Tylenol), Serum <10 (L) 10 - 30 ug/mL    Comment:        THERAPEUTIC CONCENTRATIONS VARY SIGNIFICANTLY. A RANGE OF 10-30 ug/mL MAY BE AN EFFECTIVE CONCENTRATION FOR MANY PATIENTS. HOWEVER, SOME ARE BEST TREATED AT CONCENTRATIONS OUTSIDE THIS RANGE. ACETAMINOPHEN CONCENTRATIONS >150 ug/mL AT 4 HOURS AFTER INGESTION AND >50 ug/mL AT 12 HOURS AFTER INGESTION ARE OFTEN ASSOCIATED WITH TOXIC REACTIONS.   Salicylate level     Status: None   Collection Time: 09/08/16 10:01 PM  Result Value Ref Range   Salicylate Lvl <1.3 2.8 - 30.0 mg/dL  Urine Drug Screen, Qualitative     Status: Abnormal   Collection Time: 09/08/16 10:49 PM  Result Value Ref Range   Tricyclic, Ur Screen NONE DETECTED NONE DETECTED   Amphetamines, Ur Screen POSITIVE (A) NONE DETECTED   MDMA (Ecstasy)Ur Screen NONE DETECTED NONE DETECTED   Cocaine Metabolite,Ur [place] POSITIVE (A) NONE DETECTED   Opiate, Ur Screen NONE DETECTED NONE DETECTED   Phencyclidine (PCP) Ur S NONE DETECTED NONE DETECTED   Cannabinoid 50 Ng, Ur [place] POSITIVE (A) NONE DETECTED   Barbiturates, Ur Screen NONE DETECTED NONE DETECTED   Benzodiazepine, Ur Scrn POSITIVE (A) NONE DETECTED   Methadone Scn, Ur NONE DETECTED NONE DETECTED    Comment: (NOTE) 086  Tricyclics, urine               Cutoff 1000 ng/mL 200  Amphetamines, urine             Cutoff 1000 ng/mL 300  MDMA (Ecstasy), urine           Cutoff 500 ng/mL 400  Cocaine Metabolite, urine       Cutoff 300 ng/mL 500  Opiate, urine                   Cutoff 300 ng/mL 600  Phencyclidine (PCP), urine      Cutoff 25 ng/mL 700  Cannabinoid, urine              Cutoff 50 ng/mL 800  Barbiturates, urine             Cutoff 200 ng/mL 900  Benzodiazepine, urine           Cutoff 200 ng/mL 1000 Methadone, urine                Cutoff 300 ng/mL 1100 1200 The urine drug screen provides only  a preliminary, unconfirmed 1300 analytical test result and should not be used for non-medical 1400 purposes. Clinical consideration and professional judgment should 1500 be applied to any positive drug screen result due to possible 1600 interfering substances. A more specific alternate chemical method 1700 must be used in order to obtain a confirmed analytical result.  1800 Gas chromato graphy / mass spectrometry (GC/MS) is the preferred 1900 confirmatory method.   Pregnancy, urine     Status: None   Collection Time: 09/08/16 10:49 PM  Result Value Ref Range   Preg Test, Ur NEGATIVE NEGATIVE  Urinalysis complete, with microscopic ([facility] only)     Status: Abnormal   Collection Time: 09/08/16 10:49 PM  Result Value Ref Range   Color, Urine AMBER (A) YELLOW   APPearance HAZY (A) CLEAR   Glucose, UA NEGATIVE NEGATIVE mg/dL   Bilirubin Urine NEGATIVE NEGATIVE   Ketones, ur 2+ (A) NEGATIVE mg/dL   Specific Gravity, Urine 1.025 1.005 -  1.030   Hgb urine dipstick 3+ (A) NEGATIVE   pH 5.0 5.0 - 8.0   Protein, ur 100 (A) NEGATIVE mg/dL   Nitrite POSITIVE (A) NEGATIVE   Leukocytes, UA NEGATIVE NEGATIVE   RBC / HPF TOO NUMEROUS TO COUNT 0 - 5 RBC/hpf   WBC, UA 6-30 0 - 5 WBC/hpf   Bacteria, UA RARE (A) NONE SEEN   Squamous Epithelial / LPF 6-30 (A) NONE SEEN   Mucous PRESENT     Blood Alcohol level:  Lab Results  Component Value Date   ETH <5 09/08/2016   ETH 158 (H) 54/98/2641    Metabolic Disorder Labs:  No results found for: HGBA1C, MPG No results found for: PROLACTIN No results found for: CHOL, TRIG, HDL, CHOLHDL, VLDL, LDLCALC  Current Medications: Current Facility-Administered Medications  Medication Dose Route Frequency Provider Last Rate Last Dose  . acetaminophen (TYLENOL) tablet 650 mg  650 mg Oral Q6H PRN Hildred Priest, MD      . alum & mag hydroxide-simeth (MAALOX/MYLANTA) 200-200-20 MG/5ML suspension 30 mL  30 mL Oral Q4H PRN Hildred Priest,  MD      . chlordiazePOXIDE (LIBRIUM) capsule 50 mg  50 mg Oral TID PRN Hildred Priest, MD      . magnesium hydroxide (MILK OF MAGNESIA) suspension 30 mL  30 mL Oral Daily PRN Hildred Priest, MD      . nicotine (NICODERM CQ - dosed in mg/24 hours) patch 21 mg  21 mg Transdermal Daily Hildred Priest, MD      . nitrofurantoin (macrocrystal-monohydrate) (MACROBID) capsule 100 mg  100 mg Oral Q12H Hildred Priest, MD   100 mg at 09/10/16 0901  . traZODone (DESYREL) tablet 100 mg  100 mg Oral QHS PRN Hildred Priest, MD   100 mg at 09/09/16 2339   PTA Medications: Prescriptions Prior to Admission  Medication Sig Dispense Refill Last Dose  . FLUoxetine (PROZAC) 40 MG capsule Take 1 capsule (40 mg total) by mouth daily. 7 capsule 0 Past Week at Unknown time  . lamoTRIgine (LAMICTAL) 25 MG tablet Take 2 tablets (50 mg total) by mouth daily. 14 tablet 0 Past Week at Unknown time  . traZODone (DESYREL) 100 MG tablet Take 1 tablet (100 mg total) by mouth at bedtime as needed for sleep (may repeat x 1 in 1 hour if needed). 7 tablet 0 Past Week at Unknown time  . nicotine (NICODERM CQ - DOSED IN MG/24 HOURS) 21 mg/24hr patch Place 1 patch (21 mg total) onto the skin daily. (Patient not taking: Reported on 09/09/2016) 7 patch 0 Not Taking    Musculoskeletal: Strength & Muscle Tone: within normal limits Gait & Station: normal Patient leans: N/A  Psychiatric Specialty Exam: I reviewed physical exam performed in the emergency room and agree with her findings. Physical Exam  Nursing note and vitals reviewed.   Review of Systems  Psychiatric/Behavioral: Positive for substance abuse.  All other systems reviewed and are negative.   Blood pressure 110/75, pulse 93, temperature 98.3 F (36.8 C), temperature source Oral, resp. rate 18, height 5' 5"  (1.651 m), weight 65.8 kg (145 lb), last menstrual period 08/09/2016, SpO2 98 %.Body mass index is 24.13 kg/m.   See SRA.  Sleep:  Number of Hours: 5.75    Treatment Plan Summary: Daily contact with patient to assess and evaluate symptoms and progress in treatment and Medication management   Ms. Cunanan is a 25 year old female with history of bipolar depression and substance abuse admitted for suicidal ideation in the context of relapse on substances.  1. Suicidal ideation. The patient denies any thoughts, intentions, or plans to hurt herself or others. She is able to contract for safety.  2. Mood. She has been maintained on a combination of Prozac and Lamictal. We will increase Lamictal 200 mg nightly.  3. Insomnia. Trazodone is available.  4. Smoking. Nicotine patch is available.  5. Alcohol abuse. The patient is an episodic drinker. She was started on Librium taper.  6. Substance abuse. She was positive for cocaine, cannabis, benzodiazepine admission. She still minimizes her problems and is not interested in substance abuse treatment.  7. Disposition. To be established. It is unclear if she is allowed to return to her mother's house. She will follow up with [facility].  Observation Level/Precautions:  15 minute checks  Laboratory:  CBC Chemistry Profile UDS UA  Psychotherapy:    Medications:    Consultations:    Discharge Concerns:    Estimated LOS:  Other:     Physician Treatment Plan for Primary Diagnosis: Bipolar 2 disorder, major depressive episode ([place]) Long Term Goal(s): Improvement in symptoms so as ready for discharge  Short Term Goals: Ability to identify changes in lifestyle to reduce recurrence of condition will improve, Ability to verbalize feelings will improve, Ability to disclose and discuss suicidal ideas, Ability to demonstrate self-control will improve, Ability to identify and develop effective coping behaviors will improve, Ability to maintain clinical measurements within normal limits will improve and  Compliance with prescribed medications will improve  Physician Treatment Plan for Secondary Diagnosis: Principal Problem:   Bipolar 2 disorder, major depressive episode ([place]) Active Problems:   Suicidal ideation   Alcohol dependence, binge pattern ([place])   Substance induced mood disorder ([place])   Involuntary commitment   Tobacco use disorder   Cannabis use disorder, moderate, dependence ([facility])   Cocaine use disorder, moderate, dependence ([facility])   Sedative, hypnotic or anxiolytic use disorder, mild, abuse  Long Term Goal(s): Improvement in symptoms so as ready for discharge  Short Term Goals: Ability to identify changes in lifestyle to reduce recurrence of condition will improve, Ability to demonstrate self-control will improve and Ability to identify triggers associated with substance abuse/mental health issues will improve  I certify that inpatient services furnished can reasonably be expected to improve the patient's condition.    Orson Slick, MD 10/9/201710:33 AM

## 2016-09-10 NOTE — Progress Notes (Signed)
Recreation Therapy Notes  INPATIENT RECREATION THERAPY ASSESSMENT  Patient Details Name: Briana Davis MRN: [id number] DOB: 10/07/1991 Today's Date: 09/10/2016  Patient Stressors: Other (Comment) (Not knowing if she can go back to live with her sister and mother - her sister does not want her to come back)  Coping Skills:   Isolate, Substance Abuse, Avoidance, Exercise, Art/Dance, Talking, Music, Sports, Other (Comment) (Get mind off of it)  Personal Challenges: Concentration, Decision-Making, Problem-Solving, Relationships, Self-Esteem/Confidence, Substance Abuse, Time Management  Leisure Interests (2+):  Individual - Other (Comment) (Watch football, watch movies with family)  Awareness of Community Resources:  Yes  Community Resources:  [employer], [place], [place]  Current Use: No  If no, Barriers?: Other (Comment) (Never thought about going; time)  Patient Strengths:  Big heart, loyal  Patient Identified Areas of Improvement:  Self-esteem  Current Recreation Participation:  Hanging out with family  Patient Goal for Hospitalization:  To figure out whre she will be living  [place]  [place]idence:  [place]   Current SI (including self-harm):  No  Current HI:  No  Consent to Intern Participation: N/A   [name],[name], LRT/CTRS 09/10/2016, 11:39 AM

## 2016-09-10 NOTE — Progress Notes (Signed)
Recreation Therapy Notes  Date: 10.09.17 Time: 9:30 am Location: Craft Room  Group Topic: Self-expression  Goal Area(s) Addresses:  Patient will identify one color per emotion listed on wheel. Patient will verbalize benefit of using art as a means of self-expression. Patient will verbalize one emotion experienced during session. Patient will be educated on other forms of self-expression.  Behavioral Response: Did not attend  Intervention: Emotion Wheel  Activity: Patients were given an Emotion Wheel worksheet with 7 emotions listed. Patients were instructed to pick a color for each emotion.  Education: LRT educated patients on different forms of self-expression.  Education Outcome: Patient did not attend group.  Clinical Observations/Feedback: Patient did not attend group.  Ramadan Couey M, LRT/CTRS 09/10/2016 1:31 PM 

## 2016-09-10 NOTE — BHH Counselor (Signed)
Adult Comprehensive Assessment  Patient ID: Briana Davis, female   DOB: 04-30-1991, 25 y.o.   MRN: [id number]  Information Source: Information source: Patient  Current Stressors:  Educational / Learning stressors: n/a Employment / Job issues: Patient is unemployed. Family Relationships: n/a Surveyor, quantity / Lack of resources (include bankruptcy): n/a Housing / Lack of housing: n/a Physical health (include injuries & life threatening diseases): n/a Social relationships: n/a Substance abuse: Patient denies Bereavement / Loss: n/a  Living/Environment/Situation:  Living Arrangements: Other relatives Living conditions (as described by patient or guardian): Patient reports living situation is great.  How long has patient lived in current situation?: 4 months What is atmosphere in current home: Comfortable, Paramedic, Supportive  Family History:  Marital status: Single Are you sexually active?: No What is your sexual orientation?: Heterosexual Has your sexual activity been affected by drugs, alcohol, medication, or emotional stress?: n/a Does patient have children?: No  Childhood History:  By whom was/is the patient raised?: Mother Additional childhood history information: Father died when she was 4yo Description of patient's relationship with caregiver when they were a child: Mother drank a lot when pt was a child, but was a good mother. Patient's description of current relationship with people who raised him/her: Patient states her mother is her best friend.  How were you disciplined when you got in trouble as a child/adolescent?: n/a Does patient have siblings?: Yes Number of Siblings: 1 Description of patient's current relationship with siblings: Patient currently lives with her sister.  Did patient suffer any verbal/emotional/physical/sexual abuse as a child?: No Did patient suffer from severe childhood neglect?: No Has patient ever been sexually abused/assaulted/raped as an  adolescent or adult?: No Was the patient ever a victim of a crime or a disaster?: No Witnessed domestic violence?: No Has patient been effected by domestic violence as an adult?: No  Education:  Highest grade of school patient has completed: 11th Currently a student?: No Learning disability?: No  Employment/Work Situation:   Employment situation: Unemployed Patient's job has been impacted by current illness: No What is the longest time patient has a held a job?: 3 years Where was the patient employed at that time?: Waitress Has patient ever been in the [employer]?: No Has patient ever served in combat?: No Did You Receive Any Psychiatric Treatment/Services While in Equities trader?: No Are There Guns or Education officer, community in Your Home?: No Are These Comptroller?:  (n/a)  Financial Resources:   Financial resources: Support from parents / caregiver Does patient have a Lawyer or guardian?: No  Alcohol/Substance Abuse:   What has been your use of drugs/alcohol within the last 12 months?: Alcohol and Benzo's If attempted suicide, did drugs/alcohol play a role in this?: Yes Alcohol/Substance Abuse Treatment Hx: Attends AA/NA Has alcohol/substance abuse ever caused legal problems?: No (Patient DWI when she was 22 but nothing current. )  Social Support System:   Patient's Community Support System: Good Describe Community Support System: Patient has the support from her sister and mother.  Type of faith/religion: Pentecostal How does patient's faith help to cope with current illness?: Patient states that she prays but does use religion as a support.   Leisure/Recreation:   Leisure and Hobbies: Reading and taking care of animals, taking baths to relax  Strengths/Needs:   What things does the patient do well?: "Being there for other", "Sense of humor" In what areas does patient struggle / problems for patient: low self-esteem, anxiety, depression  Discharge  Plan:  Does patient have access to transportation?: Yes (sister) Will patient be returning to same living situation after discharge?: Yes Currently receiving community mental health services: Yes (From Whom) ([facility]) Does patient have financial barriers related to discharge medications?: No  Summary/Recommendations:   Patient is a 25 year old female admitted involuntarily with a diagnosis of Bipolar 2 disorder, major depressive episode. Patient presented to the hospital after a disagreement with her sister. Patient reports primary triggers for admission were a disagreement with her sister, alcohol use, not taking her medications, and severe anxiety. Patient has established outpatient services with [facility]. Patient would like to continue her outpatient services with [facility] for medication management and outpatient therapy. Patient has the support of her sister and mother. Patient is unemployed but has financial support from her sister. Patient plans to discharge back home to live with her sister but does not know if she can return. Patient will benefit from crisis stabilization, medication evaluation, group therapy and psycho education in addition to case management for discharge. At discharge, it is recommended that patient remain compliant with established discharge plan and continued treatment.    Hampton AbbotKadijah Malisa Ruggiero, MSW, LCSW-A 09/10/2016, 10:45AM

## 2016-09-10 NOTE — Progress Notes (Signed)
Affect bright.  Denies SI/HI/AVH.  States that she is here because her sister put her here.  Verbalizes that she is not ready to talk yet.  Support and encouragement offered.  Safety maintained.
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
## 2016-09-10 NOTE — BHH Suicide Risk Assessment (Signed)
[facility]   Nursing information obtained from:  Patient, Review of record Demographic factors:  Adolescent or young adult, Caucasian, Unemployed Current Mental Status:  Suicidal ideation indicated by others Loss Factors:  Loss of significant relationship, Decrease in vocational status Historical Factors:  Prior suicide attempts Risk Reduction Factors:  Living with another person, especially a relative, Positive social support  Total Time spent with patient: 1 hour Principal Problem: Bipolar 2 disorder, major depressive episode (HCC) Diagnosis:   Patient Active Problem List   Diagnosis Date Noted  . Tobacco use disorder [F17.200] 09/10/2016  . Cannabis use disorder, moderate, dependence (HCC) [F12.20] 09/10/2016  . Cocaine use disorder, moderate, dependence (HCC) [F14.20] 09/10/2016  . Sedative, hypnotic or anxiolytic use disorder, mild, abuse [F13.10] 09/10/2016  . Drug overdose [T50.901A] 08/03/2016  . Salicylate intoxication [T39.091A] 08/03/2016  . Bipolar 2 disorder, major depressive episode (HCC) [F31.81] 08/03/2016  . Substance induced mood disorder (HCC) [F19.94] 05/05/2016  . Involuntary commitment [Z04.6] 05/05/2016  . 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 Attention deficit hyperactivity disorder (ADHD) [F90.9] 01/01/2016  . Social anxiety disorder [F40.10] 12/15/2015  . 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 Suicidal ideation [R45.851] 12/15/2015   Subjective Data: Substance abuse, suicidal ideation.  Continued Clinical Symptoms:  Alcohol Use Disorder Identification Test Final Score (AUDIT): 16 The "Alcohol Use Disorders Identification Test", Guidelines for Use in Primary Care, Second Edition.  World Science writerHealth Organization [facility](WHO). Score between 0-7:  no or low risk or alcohol related problems. Score between 8-15:  moderate  risk of alcohol related problems. Score between 16-19:  high risk of alcohol related problems. Score 20 or above:  warrants further diagnostic evaluation for alcohol dependence and treatment.   CLINICAL FACTORS:   Bipolar Disorder:   Bipolar II Depressive phase Depression:   Comorbid alcohol abuse/dependence Impulsivity Alcohol/Substance Abuse/Dependencies   Musculoskeletal: Strength & Muscle Tone: within normal limits Gait & Station: normal Patient leans: N/A  Psychiatric Specialty Exam: Physical Exam  Nursing note and vitals reviewed.   Review of Systems  Psychiatric/Behavioral: Positive for substance abuse.  All other systems reviewed and are negative.   Blood pressure 110/75, pulse 93, temperature 98.3 F (36.8 C), temperature source Oral, resp. rate 18, height 5\' 5"  (1.651 m), weight 65.8 kg (145 lb), last menstrual period 08/09/2016, SpO2 98 %.Body mass index is 24.13 kg/m.  General Appearance: Casual  Eye Contact:  Good  Speech:  Clear and Coherent  Volume:  Normal  Mood:  Anxious  Affect:  Tearful  Thought Process:  Goal Directed and Descriptions of Associations: Intact  Orientation:  Full (Time, Place, and Person)  Thought Content:  WDL  Suicidal Thoughts:  No  Homicidal Thoughts:  No  Memory:  Immediate;   Fair Recent;   Fair Remote;   Fair  Judgement:  Poor  Insight:  Shallow  Psychomotor Activity:  Normal  Concentration:  Concentration: Fair and Attention Span: Fair  Recall:  [employer]  Fund of Knowledge:  Fair  Language:  Fair  Akathisia:  No  Handed:  Right  AIMS (if indicated):     Assets:  Communication Skills Desire for Improvement Financial Resources/Insurance Physical Health Resilience Social Support  ADL's:  Intact  Cognition:  WNL  Sleep:  Number of Hours: 5.75      COGNITIVE FEATURES THAT CONTRIBUTE TO RISK:  None    SUICIDE RISK:   Mild:  Suicidal ideation of limited frequency, intensity, duration, and specificity.  There are no  identifiable plans, no associated intent, mild dysphoria and related symptoms, good self-control (both objective and subjective assessment), few other risk factors, and identifiable protective factors, including available and accessible social support.   PLAN OF CARE: Hospital admission, medication management, substance abuse counseling, discharge planning.  Ms. Breau is a 25 year old female with history of bipolar depression and substance abuse admitted for suicidal ideation in the context of relapse on substances.  1. Suicidal ideation. The patient denies any thoughts, intentions, or plans to hurt herself or others. She is able to contract for safety.  2. Mood. She has been maintained on a combination of Prozac and Lamictal. We will increase Lamictal 200 mg nightly.  3. Insomnia. Trazodone is available.  4. Smoking. Nicotine patch is available.  5. Alcohol abuse. The patient is an episodic drinker. She was started on Librium taper.  6. Substance abuse. She was positive for cocaine, cannabis, benzodiazepine admission. She still minimizes her problems and is not interested in substance abuse treatment.  7. Disposition. To be established. It is unclear if she is allowed to return to her mother's house. She will follow up with [facility].  I certify that inpatient services furnished can reasonably be expected to improve the patient's condition.  Kristine Linea, MD 09/10/2016, 10:27 AM

## 2016-09-10 NOTE — Plan of Care (Signed)
Problem: Coping: Goal: Ability to verbalize feelings will improve Outcome: Not Progressing Verbalizes that she is not ready to talk yet.

## 2016-09-11 LAB — URINE CULTURE

## 2016-09-11 LAB — RAPID HIV SCREEN (HIV 1/2 AB+AG)
HIV 1/2 Antibodies: NONREACTIVE
HIV-1 P24 Antigen - HIV24: NONREACTIVE

## 2016-09-11 MED ORDER — FOSFOMYCIN TROMETHAMINE 3 G PO PACK
3.0000 g | PACK | Freq: Once | ORAL | Status: AC
Start: 2016-09-11 — End: 2016-09-11
  Administered 2016-09-11: 3 g via ORAL
  Filled 2016-09-11: qty 3

## 2016-09-11 NOTE — BHH Group Notes (Signed)
BHH Group Notes:  (Nursing/MHT/Case Management/Adjunct)  Date:  09/11/2016  Time:  2:34 PM  Type of Therapy:  Psychoeducational Skills  Participation Level:  Did Not Attend  Briana Davis 09/11/2016, 2:34 PM

## 2016-09-11 NOTE — Progress Notes (Addendum)
[facility] MD Progress Note  09/11/2016 2:02 PM ANNICE JOLLY  MRN:  [id number]  Subjective:  Ms. Billiot met with the treatment team today. She denies symptoms of depression, anxiety, or psychosis. We restarted her on medication and she tolerates them well. She denies suicidal or homicidal ideation. She is extremely worried as her mother will not allow her to return home due to substance use and unsafe behaviors. She was given numbers for [place] [facility] to call. Social worker to inquire about a rehab bed at [facility]. There are no somatic complaints or symptoms of withdrawal. Excellent program participation.  Principal Problem: Bipolar 2 disorder, major depressive episode ([place]) Diagnosis:   Patient Active Problem List   Diagnosis Date Noted  . Tobacco use disorder [F17.200] 09/10/2016  . Cannabis use disorder, moderate, dependence ([place]) [F12.20] 09/10/2016  . Cocaine use disorder, moderate, dependence ([place]) [F14.20] 09/10/2016  . Sedative, hypnotic or anxiolytic use disorder, mild, abuse [F13.10] 09/10/2016  . Drug overdose [T50.901A] 08/03/2016  . Salicylate intoxication [T39.091A] 08/03/2016  . Bipolar 2 disorder, major depressive episode ([place]) [F31.81] 08/03/2016  . Substance induced mood disorder ([place]) [F19.94] 05/05/2016  . Involuntary commitment [Z04.6] 05/05/2016  . 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 Alcohol dependence, binge pattern ([place]) [F10.20] 01/01/2016  . Attention deficit hyperactivity disorder (ADHD) [F90.9] 01/01/2016  . Social anxiety disorder [F40.10] 12/15/2015  . 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 Suicidal ideation [R45.851] 12/15/2015   Total Time spent with patient: 20 minutes  Past Psychiatric History:  Bipolar disorder substance use.  Past Medical History:  Past Medical History:  Diagnosis Date  . Aggression   . Alcohol consumption binge drinking   . Depression   . Diverticulitis   . Kidney stone    . Suicidal ideation     Past Surgical History:  Procedure Laterality Date  . TONSILLECTOMY    . WISDOM TOOTH EXTRACTION     Family History:  Family History  Problem Relation Age of Onset  . Heart disease Mother   . Heart disease Father    Family Psychiatric  History: See H&P. Social History:  History  Alcohol Use  . Yes     History  Drug Use  . Types: Cocaine, Marijuana    Social History   Social History  . Marital status: Single    Spouse name: N/A  . Number of children: N/A  . Years of education: N/A   Occupational History  . unemployed    Social History Main Topics  . Smoking status: Current Every Day Smoker    Packs/day: 1.00    Types: Cigarettes  . Smokeless tobacco: Never Used  . Alcohol use Yes  . Drug use:     Types: Cocaine, Marijuana  . Sexual activity: No   Other Topics Concern  . None   Social History Narrative  . None   Additional Social History:    Pain Medications: denies Prescriptions: see PTA meds Over the Counter: denies History of alcohol / drug use?: Yes                    Sleep: Fair  Appetite:  Fair  Current Medications: Current Facility-Administered Medications  Medication Dose Route Frequency Provider Last Rate Last Dose  . acetaminophen (TYLENOL) tablet 650 mg  650 mg Oral Q6H PRN Hildred Priest, MD      . alum & mag hydroxide-simeth (MAALOX/MYLANTA) 200-200-20 MG/5ML suspension 30 mL  30 mL Oral  Q4H PRN Hildred Priest, MD      . chlordiazePOXIDE (LIBRIUM) capsule 25 mg  25 mg Oral TID PRN Clovis Fredrickson, MD   25 mg at 09/11/16 1342  . FLUoxetine (PROZAC) capsule 40 mg  40 mg Oral Daily Jolanta B Pucilowska, MD   40 mg at 09/11/16 0849  . lamoTRIgine (LAMICTAL) tablet 100 mg  100 mg Oral QHS Clovis Fredrickson, MD   100 mg at 09/10/16 2215  . magnesium hydroxide (MILK OF MAGNESIA) suspension 30 mL  30 mL Oral Daily PRN Hildred Priest, MD      . nicotine (NICODERM CQ -  dosed in mg/24 hours) patch 21 mg  21 mg Transdermal Daily Hildred Priest, MD      . nitrofurantoin (macrocrystal-monohydrate) (MACROBID) capsule 100 mg  100 mg Oral Q12H Hildred Priest, MD   100 mg at 09/11/16 0849  . traZODone (DESYREL) tablet 100 mg  100 mg Oral QHS PRN Hildred Priest, MD   100 mg at 09/10/16 2215    Lab Results: No results found for this or any previous visit (from the past 20 hour(s)).  Blood Alcohol level:  Lab Results  Component Value Date   ETH <5 09/08/2016   ETH 158 (H) 69/62/9528    Metabolic Disorder Labs: No results found for: HGBA1C, MPG No results found for: PROLACTIN No results found for: CHOL, TRIG, HDL, CHOLHDL, VLDL, LDLCALC  Physical Findings: AIMS: Facial and Oral Movements Muscles of Facial Expression: None, normal Lips and Perioral Area: None, normal Jaw: None, normal Tongue: None, normal,Extremity Movements Upper (arms, wrists, hands, fingers): None, normal Lower (legs, knees, ankles, toes): None, normal, Trunk Movements Neck, shoulders, hips: None, normal, Overall Severity Severity of abnormal movements (highest score from questions above): None, normal Incapacitation due to abnormal movements: None, normal Patient's awareness of abnormal movements (rate only patient's report): No Awareness, Dental Status Current problems with teeth and/or dentures?: No Does patient usually wear dentures?: No  CIWA:  CIWA-Ar Total: 0 COWS:     Musculoskeletal: Strength & Muscle Tone: within normal limits Gait & Station: normal Patient leans: N/A  Psychiatric Specialty Exam: Physical Exam  Nursing note and vitals reviewed.   Review of Systems  Psychiatric/Behavioral: Positive for substance abuse.  All other systems reviewed and are negative.   Blood pressure 111/75, pulse (!) 108, temperature 98.4 F (36.9 C), temperature source Oral, resp. rate 18, height _0  (1.651 m), weight 65.8 kg (145 lb), last  menstrual period 08/09/2016, SpO2 98 %.Body mass index is 24.13 kg/m.  General Appearance: Casual  Eye Contact:  Good  Speech:  Clear and Coherent  Volume:  Normal  Mood:  Anxious  Affect:  Tearful  Thought Process:  Goal Directed  Orientation:  Full (Time, Place, and Person)  Thought Content:  WDL  Suicidal Thoughts:  No  Homicidal Thoughts:  No  Memory:  Immediate;   Fair Recent;   Fair Remote;   Fair  Judgement:  Impaired  Insight:  Lacking  Psychomotor Activity:  Normal  Concentration:  Concentration: Fair and Attention Span: Fair  Recall:  [employer] of Knowledge:  Fair  Language:  Fair  Akathisia:  No  Handed:  Right  AIMS (if indicated):     Assets:  Communication Skills Desire for Improvement Physical Health Resilience Social Support  ADL's:  Intact  Cognition:  WNL  Sleep:  Number of Hours: 6.75     Treatment Plan Summary: Daily contact with patient to assess and evaluate  symptoms and progress in treatment and Medication management   Ms. Wexler is a 25 year old female with history of bipolar depression and substance abuse admitted for suicidal ideation in the context of relapse on substances.  1. Suicidal ideation. The patient denies any thoughts, intentions, or plans to hurt herself or others. She is able to contract for safety.  2. Mood. She has been maintained on a combination of Prozac and Lamictal. We increased Lamictal to 100 mg nightly.  3. Insomnia. Trazodone is available.  4. Smoking. Nicotine patch is available.  5. Alcohol abuse. The patient is an episodic drinker. She was started on Librium taper.  6. Substance abuse. She was positive for cocaine, cannabis, benzodiazepine admission. She still minimizes her problems and is not interested in residential substance abuse treatment. Unfortunately, [facility] beds available to the [facility] house.  7. UTI. Urine culture positive for E. Coli. Will give a dose of fosfomycin as the patient will unlikely  continue on medication following discharge.  8. STD. We will order testing.  9. Disposition. To be established. It is unclear if she is allowed to return to her mother's house. She will follow up with [facility].  Orson Slick, MD 09/11/2016, 2:02 PM

## 2016-09-11 NOTE — Plan of Care (Signed)
Problem: Education: Goal: Knowledge of the prescribed therapeutic regimen will improve Outcome: Progressing Patient able to verbalize medications

## 2016-09-11 NOTE — Progress Notes (Signed)
Recreation Therapy Notes  Date: 10.10.17 Time: 3:00 pm Location: Craft Room  Group Topic: Goal Setting  Goal Area(s) Addresses:  Patient will write at least one goal. Patient will write at least one obstacle.  Behavioral Response: Did not attend  Intervention: Recovery Goal Chart  Activity: Patients were instructed to make Recovery Goal Charts including their goals, obstacles, the date they started working on their goals, and the date they achieved their goals.  Education: LRT educated patients on healthy ways they can celebrate reaching their goals.  Education Outcome: Patient did not attend group.  Clinical Observations/Feedback: Patient did not attend group.  [name],Anastashia Westerfeld M, LRT/CTRS 09/11/2016 4:28 PM

## 2016-09-11 NOTE — Plan of Care (Signed)
Problem: Safety: Goal: Periods of time without injury will increase Outcome: Progressing Patient has remained without injury during this shift.    

## 2016-09-11 NOTE — Tx Team (Signed)
Interdisciplinary Treatment and Diagnostic Plan Update  09/11/2016 Time of Session: 10:30AM Briana Davis MRN: [id number]  Principal Diagnosis: Bipolar 2 disorder, major depressive episode (HCC)  Secondary Diagnoses: Principal Problem:   Bipolar 2 disorder, major depressive episode (HCC) Active Problems:   Suicidal ideation   Alcohol dependence, binge pattern (HCC)   Substance induced mood disorder (HCC)   Involuntary commitment   Tobacco use disorder   Cannabis use disorder, moderate, dependence (HCC)   Cocaine use disorder, moderate, dependence (HCC)   Sedative, hypnotic or anxiolytic use disorder, mild, abuse   Current Medications:  Current Facility-Administered Medications  Medication Dose Route Frequency Provider Last Rate Last Dose  . acetaminophen (TYLENOL) tablet 650 mg  650 mg Oral Q6H PRN Jimmy Footman, MD      . alum & mag hydroxide-simeth (MAALOX/MYLANTA) 200-200-20 MG/5ML suspension 30 mL  30 mL Oral Q4H PRN Jimmy Footman, MD      . chlordiazePOXIDE (LIBRIUM) capsule 25 mg  25 mg Oral TID PRN Jolanta B Pucilowska, MD      . FLUoxetine (PROZAC) capsule 40 mg  40 mg Oral Daily Jolanta B Pucilowska, MD   40 mg at 09/11/16 0849  . lamoTRIgine (LAMICTAL) tablet 100 mg  100 mg Oral QHS Shari Prows, MD   100 mg at 09/10/16 2215  . magnesium hydroxide (MILK OF MAGNESIA) suspension 30 mL  30 mL Oral Daily PRN Jimmy Footman, MD      . nicotine (NICODERM CQ - dosed in mg/24 hours) patch 21 mg  21 mg Transdermal Daily Jimmy Footman, MD      . nitrofurantoin (macrocrystal-monohydrate) (MACROBID) capsule 100 mg  100 mg Oral Q12H Jimmy Footman, MD   100 mg at 09/11/16 0849  . traZODone (DESYREL) tablet 100 mg  100 mg Oral QHS PRN Jimmy Footman, MD   100 mg at 09/10/16 2215   PTA Medications: Prescriptions Prior to Admission  Medication Sig Dispense Refill Last Dose  . FLUoxetine (PROZAC) 40 MG  capsule Take 1 capsule (40 mg total) by mouth daily. 7 capsule 0 Past Week at Unknown time  . lamoTRIgine (LAMICTAL) 25 MG tablet Take 2 tablets (50 mg total) by mouth daily. 14 tablet 0 Past Week at Unknown time  . traZODone (DESYREL) 100 MG tablet Take 1 tablet (100 mg total) by mouth at bedtime as needed for sleep (may repeat x 1 in 1 hour if needed). 7 tablet 0 Past Week at Unknown time  . nicotine (NICODERM CQ - DOSED IN MG/24 HOURS) 21 mg/24hr patch Place 1 patch (21 mg total) onto the skin daily. (Patient not taking: Reported on 09/09/2016) 7 patch 0 Not Taking    Patient Stressors: Marital or family conflict Occupational concerns Substance abuse  Patient Strengths: Average or above average intelligence Capable of independent living Physical Health Supportive family/friends  Treatment Modalities: Medication Management, Group therapy, Case management,  1 to 1 session with clinician, Psychoeducation, Recreational therapy.   Physician Treatment Plan for Primary Diagnosis: Bipolar 2 disorder, major depressive episode (HCC) Long Term Goal(s): Improvement in symptoms so as ready for discharge Improvement in symptoms so as ready for discharge   Short Term Goals: Ability to identify changes in lifestyle to reduce recurrence of condition will improve Ability to verbalize feelings will improve Ability to disclose and discuss suicidal ideas Ability to demonstrate self-control will improve Ability to identify and develop effective coping behaviors will improve Ability to maintain clinical measurements within normal limits will improve Compliance with prescribed medications will  improve Ability to identify changes in lifestyle to reduce recurrence of condition will improve Ability to demonstrate self-control will improve Ability to identify triggers associated with substance abuse/mental health issues will improve  Medication Management: Evaluate patient's response, side effects, and  tolerance of medication regimen.  Therapeutic Interventions: 1 to 1 sessions, Unit Group sessions and Medication administration.  Evaluation of Outcomes: Progressing  Physician Treatment Plan for Secondary Diagnosis: Principal Problem:   Bipolar 2 disorder, major depressive episode (HCC) Active Problems:   Suicidal ideation   Alcohol dependence, binge pattern (HCC)   Substance induced mood disorder (HCC)   Involuntary commitment   Tobacco use disorder   Cannabis use disorder, moderate, dependence (HCC)   Cocaine use disorder, moderate, dependence (HCC)   Sedative, hypnotic or anxiolytic use disorder, mild, abuse  Long Term Goal(s): Improvement in symptoms so as ready for discharge Improvement in symptoms so as ready for discharge   Short Term Goals: Ability to identify changes in lifestyle to reduce recurrence of condition will improve Ability to verbalize feelings will improve Ability to disclose and discuss suicidal ideas Ability to demonstrate self-control will improve Ability to identify and develop effective coping behaviors will improve Ability to maintain clinical measurements within normal limits will improve Compliance with prescribed medications will improve Ability to identify changes in lifestyle to reduce recurrence of condition will improve Ability to demonstrate self-control will improve Ability to identify triggers associated with substance abuse/mental health issues will improve     Medication Management: Evaluate patient's response, side effects, and tolerance of medication regimen.  Therapeutic Interventions: 1 to 1 sessions, Unit Group sessions and Medication administration.  Evaluation of Outcomes: Progressing   RN Treatment Plan for Primary Diagnosis: Bipolar 2 disorder, major depressive episode (HCC) Long Term Goal(s): Knowledge of disease and therapeutic regimen to maintain health will improve  Short Term Goals: Ability to remain free from injury will  improve, Ability to verbalize frustration and anger appropriately will improve, Ability to participate in decision making will improve, Ability to disclose and discuss suicidal ideas, Ability to identify and develop effective coping behaviors will improve and Compliance with prescribed medications will improve  Medication Management: RN will administer medications as ordered by provider, will assess and evaluate patient's response and provide education to patient for prescribed medication. RN will report any adverse and/or side effects to prescribing provider.  Therapeutic Interventions: 1 on 1 counseling sessions, Psychoeducation, Medication administration, Evaluate responses to treatment, Monitor vital signs and CBGs as ordered, Perform/monitor CIWA, COWS, AIMS and Fall Risk screenings as ordered, Perform wound care treatments as ordered.  Evaluation of Outcomes: Progressing   LCSW Treatment Plan for Primary Diagnosis: Bipolar 2 disorder, major depressive episode (HCC) Long Term Goal(s): Safe transition to appropriate next level of care at discharge, Engage patient in therapeutic group addressing interpersonal concerns.  Short Term Goals: Engage patient in aftercare planning with referrals and resources, Increase social support, Facilitate acceptance of mental health diagnosis and concerns, Identify triggers associated with mental health/substance abuse issues and Increase skills for wellness and recovery  Therapeutic Interventions: Assess for all discharge needs, 1 to 1 time with Social worker, Explore available resources and support systems, Assess for adequacy in community support network, Educate family and significant other(s) on suicide prevention, Complete Psychosocial Assessment, Interpersonal group therapy.  Evaluation of Outcomes: Progressing   Progress in Treatment: Attending groups: Yes. Participating in groups: Yes. Taking medication as prescribed: Yes. Toleration medication:  Yes. Family/Significant other contact made: Yes, individual(s) contacted:  mother Patient  understands diagnosis: Yes. Discussing patient identified problems/goals with staff: Yes. Medical problems stabilized or resolved: Yes. Denies suicidal/homicidal ideation: Yes. Issues/concerns per patient self-inventory: No.   New problem(s) identified: No, Describe:  None  New Short Term/Long Term Goal(s):  Discharge Plan or Barriers:   Reason for Continuation of Hospitalization: Depression Suicidal ideation  Estimated Length of Stay:  Attendees: Patient: Briana Davis  09/11/2016 11:22 AM  Physician: Kristine Linea, MD 09/11/2016 11:22 AM  Nursing: Hulan Amato, RN 09/11/2016 11:22 AM  RN Care Manager: 09/11/2016 11:22 AM  Social Worker: Hampton Abbot, MSW, LCSW-A 09/11/2016 11:22 AM  Recreational Therapist: Hershal Coria, LRT, CTRS 09/11/2016 11:22 AM    Scribe for Treatment Team: Lynden Oxford, LCSWA 09/11/2016 11:22 AM

## 2016-09-11 NOTE — Plan of Care (Signed)
Problem: Activity: Goal: Interest or engagement in leisure activities will improve Outcome: Not Progressing Patient has isolated to room.

## 2016-09-11 NOTE — B[facility] Group Notes (Signed)
Goals Group  Date/Time: 09/11/2016 9am  Type of Therapy and Topic: Group Therapy: Goals Group: SMART Goals   Pt was called, but did not attend   Mickelle Goupil F. Pranish Akhavan, LCSWA, LCAS  

## 2016-09-11 NOTE — Progress Notes (Signed)
D: Pt presents sad, and flat this am. When asked why patient is here. Pt became tearful and was not ready to talk. Pt later in shift brighter, reports feeling better. Pt sleeping and isolating to room most of shift. Did attend some groups today. Medication compliant. Reporting withdrawal symptoms. ] A; Encouragement and support offered. Encouraged pt to verbalize feelings and to attend group. Medication given as prescribed. Safety checks maintained. R: pt receptive and remains safe on unit with q 15 min checks.

## 2016-09-11 NOTE — Plan of Care (Signed)
Problem: [facility] Participation in Recreation Therapeutic Interventions Goal: STG-Patient will demonstrate improved self esteem by identif STG: Self-Esteem - Within 4 treatment sessions, patient will verbalize at least 5 positive affirmation statements in each of 2 treatment sessions to increase self-esteem post d/c.  Outcome: Progressing Treatment Session 1; Completed 1 out of 2: At approximately 1:30 pm, LRT met with patient in consultation room. Patient verbalized 5 positive affirmation statements. Patient reported it felt "good". LRT encouraged patient to continue saying positive affirmation statements.  Leonette Monarch, LRT/CTRS 10.10.17 2:23 pm Goal: STG-Patient will identify at least five coping skills for ** STG: Coping Skills - Within 4 treatment sessions, patient will verbalize at least 5 coping skills for substance abuse in each of 2 treatment sessions to decrease substance abuse post d/c.  Outcome: Progressing Treatment Session 1; Completed 1 out of 2: At approximately 1:30 pm, LRT met with patient in consultation room. Patient verbalized 5 coping skills for substance abuse. LRT educated patient on leisure and why it is important to implement it into her schedule. LRT educated and provided patient with blank schedules to help her plan her day and try to avoid using substances. LRT educated patient on healthy support systems.  Leonette Monarch, LRT/CTRS 10.10.17 2:24 pm

## 2016-09-11 NOTE — Progress Notes (Signed)
D: Patient appears very sad and anxious at times. Tearful when talking about situation. Stated she's anxious because [facility]. She denies SI/HI/AVH. Patient isolated to room.  A: Medication given with education. Encouragement provided. PRN Trazodone given.  R: Patient was compliant with medication. She has remained calm and cooperative. Safety maintained with 15 min checks.

## 2016-09-11 NOTE — B[facility] Group Notes (Signed)
[facility] LCSW Group Therapy   09/11/2016  9:30am  Type of Therapy: Group Therapy   Participation Level: Did Not Attend. Patient invited to participate but declined.    Dorothe PeaJonathan F. Aloha Bartok, MSW, LCSWA, LCAS

## 2016-09-11 NOTE — Progress Notes (Signed)
D: Patient stated she's not ready to talk about why she's here. She did state she's anxious because she doesn't know if she can go back and live with her mom. She denies SI/HI/AVH. Patient isolated to room.  A: Medication given with education. Encouragement provided.  R: Patient was compliant with medication. She has remained calm and cooperative. Safety maintained with 15 min checks.

## 2016-09-12 LAB — SYPHILIS: RPR W/REFLEX TO RPR TITER AND TREPONEMAL ANTIBODIES, TRADITIONAL SCREENING AND DIAGNOSIS ALGORITHM: RPR Ser Ql: NONREACTIVE

## 2016-09-12 LAB — HEPATITIS C ANTIBODY (REFLEX): HCV Ab: <0.1 {s_co_ratio} (ref 0.0–0.9)

## 2016-09-12 LAB — HCV COMMENT:

## 2016-09-12 MED ORDER — TRAZODONE HCL 100 MG PO TABS: 100.0000 mg | ORAL_TABLET | Freq: Every evening | ORAL | 1 refills | Status: DC | PRN

## 2016-09-12 MED ORDER — LAMOTRIGINE 100 MG PO TABS: 100.0000 mg | ORAL_TABLET | Freq: Every day | ORAL | 1 refills | Status: AC

## 2016-09-12 MED ORDER — FLUOXETINE HCL 40 MG PO CAPS: 40.0000 mg | ORAL_CAPSULE | Freq: Every day | ORAL | 1 refills | Status: DC

## 2016-09-12 MED ORDER — LAMOTRIGINE 100 MG PO TABS: 100.0000 mg | ORAL_TABLET | Freq: Every day | ORAL | 1 refills | Status: DC

## 2016-09-12 MED ORDER — TRAZODONE HCL 100 MG PO TABS: 100.0000 mg | ORAL_TABLET | Freq: Every evening | ORAL | 1 refills | Status: AC | PRN

## 2016-09-12 NOTE — Plan of Care (Signed)
Problem: [facility] Participation in Recreation Therapeutic Interventions Goal: STG-Patient will demonstrate improved self esteem by identif STG: Self-Esteem - Within 4 treatment sessions, patient will verbalize at least 5 positive affirmation statements in each of 2 treatment sessions to increase self-esteem post d/c.  Outcome: Completed/Met Date Met: 09/12/16 Treatment Session 2; Completed 2 out of 2: At approximately 2:00 pm, LRT met with patient in patient room. Patient verbalized 5 positive affirmation statements. LRT encouraged patient to continue saying positive affirmation statements.  Leonette Monarch, LRT/CTRS 10.11.17 2:22 pm Goal: STG-Patient will identify at least five coping skills for ** STG: Coping Skills - Within 4 treatment sessions, patient will verbalize at least 5 coping skills for substance abuse in each of 2 treatment sessions to decrease substance abuse post d/c.  Outcome: Completed/Met Date Met: 09/12/16 Treatment Session 2; Completed 2 out of 2: At approximately 2:00 pm, LRT met with patient in patient room. Patient verbalized 5 coping skills for substance abuse. LRT encouraged patient to use her coping skills to help her avoid substances.  Leonette Monarch, LRT/CTRS 10.11.17 2:24 pm

## 2016-09-12 NOTE — [facility] Suicide Risk Assessment (Signed)
[facility] Discharge Suicide Risk Assessment   Principal Problem: Bipolar 2 disorder, major depressive episode [facility]) Discharge Diagnoses:  Patient Active Problem List   Diagnosis Date Noted  . Tobacco use disorder [F17.200] 09/10/2016  . Cannabis use disorder, moderate, dependence (HCC) [F12.20] 09/10/2016  . Cocaine use disorder, moderate, dependence (HCC) [F14.20] 09/10/2016  . Sedative, hypnotic or anxiolytic use disorder, mild, abuse [F13.10] 09/10/2016  . Drug overdose [T50.901A] 08/03/2016  . Salicylate intoxication [T39.091A] 08/03/2016  . Bipolar 2 disorder, major depressive episode (HCC) [F31.81] 08/03/2016  . Substance induced mood disorder (HCC) [F19.94] 05/05/2016  . Involuntary commitment [Z04.6] 05/05/2016  . 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 Attention deficit hyperactivity disorder (ADHD) [F90.9] 01/01/2016  . Social anxiety disorder [F40.10] 12/15/2015  . 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 Suicidal ideation [R45.851] 12/15/2015    Total Time spent with patient: 30 minutes  Musculoskeletal: Strength & Muscle Tone: within normal limits Gait & Station: normal Patient leans: N/A  Psychiatric Specialty Exam: Review of Systems  Psychiatric/Behavioral: Positive for substance abuse. The patient is nervous/anxious.   All other systems reviewed and are negative.   Blood pressure (!) 104/52, pulse 88, temperature 98.5 F (36.9 C), resp. rate 18, height 5\' 5"  (1.651 m), weight 65.8 kg (145 lb), last menstrual period 08/09/2016, SpO2 98 %.Body mass index is 24.13 kg/m.  General Appearance: Casual  Eye Contact::  Good  Speech:  Clear and Coherent409  Volume:  Normal  Mood:  Anxious  Affect:  Appropriate  Thought Process:  Goal Directed and Descriptions of Associations: Intact  Orientation:  Full (Time, Place, and Person)  Thought Content:  WDL   Suicidal Thoughts:  No  Homicidal Thoughts:  No  Memory:  Immediate;   Fair Recent;   Fair Remote;   Fair  Judgement:  Impaired  Insight:  Shallow  Psychomotor Activity:  Normal  Concentration:  Fair  Recall:  [employer] of Knowledge:Fair  Language: Fair  Akathisia:  No  Handed:  Right  AIMS (if indicated):     Assets:  Communication Skills Desire for Improvement Physical Health Resilience Social Support  Sleep:  Number of Hours: 8  Cognition: WNL  ADL's:  Intact   Mental Status Per Nursing Assessment::   On Admission:  Suicidal ideation indicated by others  Demographic Factors:  Adolescent or young adult, Caucasian and Unemployed  Loss Factors: Loss of significant relationship and Financial problems/change in socioeconomic status  Historical Factors: Prior suicide attempts and Impulsivity  Risk Reduction Factors:   Sense of responsibility to family, Positive social support and Positive therapeutic relationship  Continued Clinical Symptoms:  Bipolar Disorder:   Depressive phase Alcohol/Substance Abuse/Dependencies  Cognitive Features That Contribute To Risk:  None    Suicide Risk:  Minimal: No identifiable suicidal ideation.  Patients presenting with no risk factors but with morbid ruminations; may be classified as minimal risk based on the severity of the depressive symptoms  Follow-up Information    [facility]. Go on 09/14/2016.   Why:  Please arrive to your hospital follow-up appointment at [facility] for medication management and therapy at [facility]. It is important to bring your discharge paperwork to this appointment in case of medication adjustments. Questions or concerns, contact [facility]. Contact information: [employer] of  [address] [place] [address] Ph: [phone] Fax: [phone]          Plan  Of Care/Follow-up recommendations:  Activity:  As tolerated. Diet:  Regular. Other:  Keep follow-up  appointments.  Briana LineaJolanta Ernestyne Caldwell, MD 09/12/2016, 2:07 PM

## 2016-09-12 NOTE — Progress Notes (Signed)
Pt presents brighter this shift. Denies SI, HI, AVH. Pt calling sister to see if she is able to come back to live with her. Pt reports does not want to go to [facility] after discharge. Reports she will do whatever it takes to be able to live with sister.  Encouragement and support offered. Medications given as prescribed. Safety checks maintained.  Pt receptive and remains safe on unit with q 15 min checks.

## 2016-09-12 NOTE — Plan of Care (Signed)
Problem: Safety: Goal: Ability to remain free from injury will improve Outcome: Progressing No injuries reported or observed   

## 2016-09-12 NOTE — Progress Notes (Signed)
[facility] MD Progress Note  09/12/2016 1:58 PM ANJELI CASAD  MRN:  [id number]  Subjective:  Ms. Bolding is very frightened and overwhelmed today. She feels suicidal. Her mother refused to let her back home. She will meet with her mother and her sister tonight and try to convince her sister to take her in. met with the treatment team today. She will also have an interview with [facility] tonight. She worries that she does not have $100 to pay initially and has no job to continue payments. She has never been away from her family. She fantasizes that she would stay with her sister, get a job, and go to [facility] for substance abuse treatment. She denies any symptoms of withdrawal. There are no somatic complaints. Sleep and appetite are fair. She has not been participating in groups secondary to anxiety.   Principal Problem: Bipolar 2 disorder, major depressive episode ([place]) Diagnosis:   Patient Active Problem List   Diagnosis Date Noted  . Tobacco use disorder [F17.200] 09/10/2016  . Cannabis use disorder, moderate, dependence ([place]) [F12.20] 09/10/2016  . Cocaine use disorder, moderate, dependence ([place]) [F14.20] 09/10/2016  . Sedative, hypnotic or anxiolytic use disorder, mild, abuse [F13.10] 09/10/2016  . Drug overdose [T50.901A] 08/03/2016  . Salicylate intoxication [T39.091A] 08/03/2016  . Bipolar 2 disorder, major depressive episode ([place]) [F31.81] 08/03/2016  . Substance induced mood disorder ([place]) [F19.94] 05/05/2016  . Involuntary commitment [Z04.6] 05/05/2016  . 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 Alcohol dependence, binge pattern ([place]) [F10.20] 01/01/2016  . Attention deficit hyperactivity disorder (ADHD) [F90.9] 01/01/2016  . Social anxiety disorder [F40.10] 12/15/2015  . 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 Suicidal ideation [R45.851] 12/15/2015   Total Time spent with patient: 20 minutes  Past Psychiatric History:  Bipolar  disorder substance use.  Past Medical History:  Past Medical History:  Diagnosis Date  . Aggression   . Alcohol consumption binge drinking   . Depression   . Diverticulitis   . Kidney stone   . Suicidal ideation     Past Surgical History:  Procedure Laterality Date  . TONSILLECTOMY    . WISDOM TOOTH EXTRACTION     Family History:  Family History  Problem Relation Age of Onset  . Heart disease Mother   . Heart disease Father    Family Psychiatric  History: See H&P. Social History:  History  Alcohol Use  . Yes     History  Drug Use  . Types: Cocaine, Marijuana    Social History   Social History  . Marital status: Single    Spouse name: N/A  . Number of children: N/A  . Years of education: N/A   Occupational History  . unemployed    Social History Main Topics  . Smoking status: Current Every Day Smoker    Packs/day: 1.00    Types: Cigarettes  . Smokeless tobacco: Never Used  . Alcohol use Yes  . Drug use:     Types: Cocaine, Marijuana  . Sexual activity: No   Other Topics Concern  . None   Social History Narrative  . None   Additional Social History:    Pain Medications: denies Prescriptions: see PTA meds Over the Counter: denies History of alcohol / drug use?: Yes                    Sleep: Fair  Appetite:  Fair  Current Medications: Current Facility-Administered Medications  Medication  Dose Route Frequency Provider Last Rate Last Dose  . acetaminophen (TYLENOL) tablet 650 mg  650 mg Oral Q6H PRN Hildred Priest, MD      . alum & mag hydroxide-simeth (MAALOX/MYLANTA) 200-200-20 MG/5ML suspension 30 mL  30 mL Oral Q4H PRN Hildred Priest, MD      . FLUoxetine (PROZAC) capsule 40 mg  40 mg Oral Daily Clovis Fredrickson, MD   40 mg at 09/12/16 0824  . lamoTRIgine (LAMICTAL) tablet 100 mg  100 mg Oral QHS Clovis Fredrickson, MD   100 mg at 09/11/16 2106  . magnesium hydroxide (MILK OF MAGNESIA) suspension 30 mL   30 mL Oral Daily PRN Hildred Priest, MD      . nicotine (NICODERM CQ - dosed in mg/24 hours) patch 21 mg  21 mg Transdermal Daily Hildred Priest, MD      . traZODone (DESYREL) tablet 100 mg  100 mg Oral QHS PRN Hildred Priest, MD   100 mg at 09/11/16 2105    Lab Results:  Results for orders placed or performed during the hospital encounter of 09/09/16 (from the past 48 hour(s))  RPR     Status: None   Collection Time: 09/11/16  2:19 PM  Result Value Ref Range   RPR Ser Ql Non Reactive Non Reactive    Comment: (NOTE) Performed At: [facility] [place], [place] [id number] Lindon Romp MD ST:[id number]   Rapid HIV screen (HIV 1/2 Ab+Ag)     Status: None   Collection Time: 09/11/16  2:19 PM  Result Value Ref Range   HIV-1 P24 Antigen - HIV24 NON REACTIVE NON REACTIVE   HIV 1/2 Antibodies NON REACTIVE NON REACTIVE   Interpretation (HIV Ag Ab)      A non reactive test result means that HIV 1 or HIV 2 antibodies and HIV 1 p24 antigen were not detected in the specimen.  Hepatitis c antibody (reflex)     Status: None   Collection Time: 09/11/16  2:19 PM  Result Value Ref Range   HCV Ab <0.1 0.0 - 0.9 s/co ratio    Comment: (NOTE) Performed At: [facility] [place], [place] [id number] Lindon Romp MD EY:[id number]   HCV Comment:     Status: None   Collection Time: 09/11/16  2:19 PM  Result Value Ref Range   Comment: Comment     Comment: (NOTE) Non reactive HCV antibody screen is consistent with no HCV infection, unless recent infection is suspected or other evidence exists to indicate HCV infection. Performed At: [facility] [address] [place], [place] [id number] Lindon Romp MD CH:[id number]     Blood Alcohol level:  Lab Results  Component Value Date   [facility] <5 09/08/2016   ETH 158 (H) 87/86/7672    Metabolic Disorder Labs: No results found for: HGBA1C, MPG No results  found for: PROLACTIN No results found for: CHOL, TRIG, HDL, CHOLHDL, VLDL, LDLCALC  Physical Findings: AIMS: Facial and Oral Movements Muscles of Facial Expression: None, normal Lips and Perioral Area: None, normal Jaw: None, normal Tongue: None, normal,Extremity Movements Upper (arms, wrists, hands, fingers): None, normal Lower (legs, knees, ankles, toes): None, normal, Trunk Movements Neck, shoulders, hips: None, normal, Overall Severity Severity of abnormal movements (highest score from questions above): None, normal Incapacitation due to abnormal movements: None, normal Patient's awareness of abnormal movements (rate only patient's report): No Awareness, Dental Status Current problems with teeth and/or dentures?: No Does patient usually wear  dentures?: No  CIWA:  CIWA-Ar Total: 4 COWS:     Musculoskeletal: Strength & Muscle Tone: within normal limits Gait & Station: normal Patient leans: N/A  Psychiatric Specialty Exam: Physical Exam  Nursing note and vitals reviewed.   Review of Systems  Psychiatric/Behavioral: Positive for substance abuse.  All other systems reviewed and are negative.   Blood pressure (!) 104/52, pulse 88, temperature 98.5 F (36.9 C), resp. rate 18, height 5' 5"  (1.651 m), weight 65.8 kg (145 lb), last menstrual period 08/09/2016, SpO2 98 %.Body mass index is 24.13 kg/m.  General Appearance: Casual  Eye Contact:  Good  Speech:  Clear and Coherent  Volume:  Normal  Mood:  Anxious  Affect:  Tearful  Thought Process:  Goal Directed  Orientation:  Full (Time, Place, and Person)  Thought Content:  WDL  Suicidal Thoughts:  No  Homicidal Thoughts:  No  Memory:  Immediate;   Fair Recent;   Fair Remote;   Fair  Judgement:  Impaired  Insight:  Lacking  Psychomotor Activity:  Normal  Concentration:  Concentration: Fair and Attention Span: Fair  Recall:  [employer] of Knowledge:  Fair  Language:  Fair  Akathisia:  No  Handed:  Right  AIMS (if  indicated):     Assets:  Communication Skills Desire for Improvement Physical Health Resilience Social Support  ADL's:  Intact  Cognition:  WNL  Sleep:  Number of Hours: 8     Treatment Plan Summary: Daily contact with patient to assess and evaluate symptoms and progress in treatment and Medication management   Ms. Roskelley is a 25 year old female with history of bipolar depression and substance abuse admitted for suicidal ideation in the context of relapse on substances.  1. Suicidal ideation. The patient denies any thoughts, intentions, or plans to hurt herself or others. She is able to contract for safety.  2. Mood. She has been maintained on a combination of Prozac and Lamictal. We increased Lamictal to 100 mg nightly.  3. Insomnia. Trazodone is available.  4. Smoking. Nicotine patch is available.  5. Alcohol abuse. The patient is an episodic drinker. She was started on Librium taper.  6. Substance abuse. She was positive for cocaine, cannabis, benzodiazepine admission. She still minimizes her problems and is not interested in residential substance abuse treatment. Unfortunately, no beds available to the [facility] house.  7. UTI. Urine culture positive for E. Coli. Will give a dose of fosfomycin as the patient will unlikely continue on medication following discharge.  8. STD. We will order testing.  9. Disposition. To be established. It is unclear if she is allowed to return to her mother's house. She will follow up with [facility].  Orson Slick, MD 09/12/2016, 1:58 PM

## 2016-09-12 NOTE — BHH Group Notes (Signed)
[facility] LCSW Group Therapy   09/12/2016  9:30am  Type of Therapy: Group Therapy   Participation Level: Did Not Attend. Patient invited to participate but declined.    Endia Moncur F. Janisa Labus, MSW, LCSWA, LCAS     

## 2016-09-12 NOTE — B[facility] Group Notes (Signed)
[facility] Group Notes:  (Nursing/MHT/Case Management/Adjunct)  Date:  09/12/2016  Time:  5:49 PM  Type of Therapy:  Psychoeducational Skills  Participation Level:  Did Not Attend   Noelle PennerKristen J Zamaria Brazzle 09/12/2016, 5:49 PM

## 2016-09-12 NOTE — Progress Notes (Signed)
Recreation Therapy Notes  Date: 10.11.17 Time: 1:00 pm Location: Craft Room  Group Topic: Self-esteem  Goal Area(s) Addresses:  Patient will write at least one positive trait about self. Patient will verbalize why it is important to have a healthy self-esteem.  Behavioral Response: Did not attend  Intervention: I Am  Activity: Patients were given a worksheet with the letter I on it and instructed to write as many positive traits inside the letter.   Education: LRT educated patients on ways they can increase their self-esteem.  Education Outcome: Patient did not attend group.  Clinical Observations/Feedback: Patient did not attend group.  [name],Makaylynn Bonillas M, LRT/CTRS 09/12/2016 3:04 PM

## 2016-09-12 NOTE — Social Work (Signed)
CSW spoke with house resident, Briana DavenportSandra at [phone]:[phone] who stated that she will prescreen patient via telephone today, 09/12/2016 with residents from [facility] at [address] [place].      Hampton AbbotKadijah Cisco Kindt, MSW, LCSW-A 09/12/2016, 11:55AM

## 2016-09-13 NOTE — BHH Suicide Risk Assessment (Signed)
[facility] Discharge Suicide Risk Assessment   Principal Problem: Bipolar 2 disorder, major depressive episode [facility]) Discharge Diagnoses:  Patient Active Problem List   Diagnosis Date Noted  . Tobacco use disorder [F17.200] 09/10/2016  . Cannabis use disorder, moderate, dependence (HCC) [F12.20] 09/10/2016  . Cocaine use disorder, moderate, dependence (HCC) [F14.20] 09/10/2016  . Sedative, hypnotic or anxiolytic use disorder, mild, abuse [F13.10] 09/10/2016  . Drug overdose [T50.901A] 08/03/2016  . Salicylate intoxication [T39.091A] 08/03/2016  . Bipolar 2 disorder, major depressive episode (HCC) [F31.81] 08/03/2016  . Substance induced mood disorder (HCC) [F19.94] 05/05/2016  . Involuntary commitment [Z04.6] 05/05/2016  . 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 Attention deficit hyperactivity disorder (ADHD) [F90.9] 01/01/2016  . Social anxiety disorder [F40.10] 12/15/2015  . 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 Suicidal ideation [R45.851] 12/15/2015    Total Time spent with patient: 30 minutes  Musculoskeletal: Strength & Muscle Tone: within normal limits Gait & Station: normal Patient leans: N/A  Psychiatric Specialty Exam: Review of Systems  Psychiatric/Behavioral: Positive for substance abuse. The patient is nervous/anxious.   All other systems reviewed and are negative.   Blood pressure 97/69, pulse 82, temperature 98.8 F (37.1 C), temperature source Oral, resp. rate 18, height 5\' 5"  (1.651 m), weight 65.8 kg (145 lb), last menstrual period 08/09/2016, SpO2 98 %.Body mass index is 24.13 kg/m.  General Appearance: Casual  Eye Contact::  Good  Speech:  Clear and Coherent409  Volume:  Normal  Mood:  Anxious  Affect:  Appropriate  Thought Process:  Goal Directed and Descriptions of Associations: Intact  Orientation:  Full (Time, Place, and Person)   Thought Content:  WDL  Suicidal Thoughts:  No  Homicidal Thoughts:  No  Memory:  Immediate;   Fair Recent;   Fair Remote;   Fair  Judgement:  Impaired  Insight:  Shallow  Psychomotor Activity:  Normal  Concentration:  Fair  Recall:  [employer] of Knowledge:Fair  Language: Fair  Akathisia:  No  Handed:  Right  AIMS (if indicated):     Assets:  Communication Skills Desire for Improvement Physical Health Resilience Social Support  Sleep:  Number of Hours: 8.15  Cognition: WNL  ADL's:  Intact   Mental Status Per Nursing Assessment::   On Admission:  Suicidal ideation indicated by others  Demographic Factors:  Adolescent or young adult, Caucasian and Unemployed  Loss Factors: Loss of significant relationship and Financial problems/change in socioeconomic status  Historical Factors: Prior suicide attempts and Impulsivity  Risk Reduction Factors:   Sense of responsibility to family, Positive social support and Positive therapeutic relationship  Continued Clinical Symptoms:  Bipolar Disorder:   Depressive phase Alcohol/Substance Abuse/Dependencies  Cognitive Features That Contribute To Risk:  None    Suicide Risk:  Minimal: No identifiable suicidal ideation.  Patients presenting with no risk factors but with morbid ruminations; may be classified as minimal risk based on the severity of the depressive symptoms  Follow-up Information    [facility] .   Specialty:  Behavioral Health Why:  Please arrive to the walk-in clinic between the hours of 9-5pm Monday-Friday for your assessment for medication management, substance abuse treatment and therapy. Contact information: [address] [place] [address] [phone]        [facility] .   Why:  Please arrive on Thursday October 12th, 2017 to be admitted for long-term residential living for substance  use Contact information: [facility] [address]     [place], [place] [address]    Ph:  [phone] Fax: DO NOT FAX NOT NEEDED               Plan Of Care/Follow-up recommendations:  Activity:  As tolerated. Diet:  Regular. Other:  Keep follow-up appointments.  Kristine LineaJolanta Brooklyn Jeff, MD 09/13/2016, 12:06 PM

## 2016-09-13 NOTE — Progress Notes (Signed)
  [facility] :  Will you be returning to the same living situation after discharge:  No. [place] [facility] At discharge, do you have transportation home?: Yes,  pt will be provided with a bus pass Do you have the ability to pay for your medications: Yes,  pt will be provided with prescriptions at discharge  Release of information consent forms completed and in the chart;  Patient's signature needed at discharge.  Patient to Follow up at: Follow-up Information    [facility] .   Specialty:  Behavioral Health Why:  Please arrive to the walk-in clinic between the hours of 9-5pm Monday-Friday for your assessment for medication management, substance abuse treatment and therapy. Contact information: [address] [place] [address] [phone]        [facility] .   Why:  Please arrive on Thursday October 12th, 2017 to be admitted for long-term residential living for substance use Contact information: [facility] [address]     [place], [place] [address]    Ph: [phone] Fax: DO NOT FAX NOT NEEDED               Next level of care provider has access to [facility] Link:no  Safety Planning and Suicide Prevention discussed: Yes,  completed with pt  Have you used any form of tobacco in the last 30 days? (Cigarettes, Smokeless Tobacco, Cigars, and/or Pipes): Yes  Has patient been referred to the Quitline?: Patient refused referral  Patient has been referred for addiction treatment: Yes  Briana PeaJonathan F Jaqwon Davis 09/13/2016, 12:49 PM

## 2016-09-13 NOTE — Tx Team (Signed)
Interdisciplinary Treatment and Diagnostic Plan Update  09/13/2016 Time of Session: 10:30AM Briana Davis MRN: [id number]  Principal Diagnosis: Bipolar 2 disorder, major depressive episode (HCC)  Secondary Diagnoses: Principal Problem:   Bipolar 2 disorder, major depressive episode (HCC) Active Problems:   Suicidal ideation   Alcohol dependence, binge pattern (HCC)   Substance induced mood disorder (HCC)   Involuntary commitment   Tobacco use disorder   Cannabis use disorder, moderate, dependence (HCC)   Cocaine use disorder, moderate, dependence (HCC)   Sedative, hypnotic or anxiolytic use disorder, mild, abuse   Current Medications:  Current Facility-Administered Medications  Medication Dose Route Frequency Provider Last Rate Last Dose  . acetaminophen (TYLENOL) tablet 650 mg  650 mg Oral Q6H PRN Jimmy Footman, MD      . alum & mag hydroxide-simeth (MAALOX/MYLANTA) 200-200-20 MG/5ML suspension 30 mL  30 mL Oral Q4H PRN Jimmy Footman, MD      . FLUoxetine (PROZAC) capsule 40 mg  40 mg Oral Daily Jolanta B Pucilowska, MD   40 mg at 09/13/16 0840  . lamoTRIgine (LAMICTAL) tablet 100 mg  100 mg Oral QHS Shari Prows, MD   100 mg at 09/12/16 2135  . magnesium hydroxide (MILK OF MAGNESIA) suspension 30 mL  30 mL Oral Daily PRN Jimmy Footman, MD      . nicotine (NICODERM CQ - dosed in mg/24 hours) patch 21 mg  21 mg Transdermal Daily Jimmy Footman, MD      . traZODone (DESYREL) tablet 100 mg  100 mg Oral QHS PRN Jimmy Footman, MD   100 mg at 09/12/16 2135   PTA Medications: Prescriptions Prior to Admission  Medication Sig Dispense Refill Last Dose  . lamoTRIgine (LAMICTAL) 25 MG tablet Take 2 tablets (50 mg total) by mouth daily. 14 tablet 0 Past Week at Unknown time  . [DISCONTINUED] FLUoxetine (PROZAC) 40 MG capsule Take 1 capsule (40 mg total) by mouth daily. 7 capsule 0 Past Week at Unknown time  .  [DISCONTINUED] traZODone (DESYREL) 100 MG tablet Take 1 tablet (100 mg total) by mouth at bedtime as needed for sleep (may repeat x 1 in 1 hour if needed). 7 tablet 0 Past Week at Unknown time  . nicotine (NICODERM CQ - DOSED IN MG/24 HOURS) 21 mg/24hr patch Place 1 patch (21 mg total) onto the skin daily. (Patient not taking: Reported on 09/09/2016) 7 patch 0 Not Taking    Patient Stressors: Marital or family conflict Occupational concerns Substance abuse  Patient Strengths: Average or above average intelligence Capable of independent living Physical Health Supportive family/friends  Treatment Modalities: Medication Management, Group therapy, Case management,  1 to 1 session with clinician, Psychoeducation, Recreational therapy.   Physician Treatment Plan for Primary Diagnosis: Bipolar 2 disorder, major depressive episode (HCC) Long Term Goal(s): Improvement in symptoms so as ready for discharge Improvement in symptoms so as ready for discharge   Short Term Goals: Ability to identify changes in lifestyle to reduce recurrence of condition will improve Ability to verbalize feelings will improve Ability to disclose and discuss suicidal ideas Ability to demonstrate self-control will improve Ability to identify and develop effective coping behaviors will improve Ability to maintain clinical measurements within normal limits will improve Compliance with prescribed medications will improve Ability to identify changes in lifestyle to reduce recurrence of condition will improve Ability to demonstrate self-control will improve Ability to identify triggers associated with substance abuse/mental health issues will improve  Medication Management: Evaluate patient's response, side effects, and tolerance  of medication regimen.  Therapeutic Interventions: 1 to 1 sessions, Unit Group sessions and Medication administration.  Evaluation of Outcomes: Progressing  Physician Treatment Plan for  Secondary Diagnosis: Principal Problem:   Bipolar 2 disorder, major depressive episode (HCC) Active Problems:   Suicidal ideation   Alcohol dependence, binge pattern (HCC)   Substance induced mood disorder (HCC)   Involuntary commitment   Tobacco use disorder   Cannabis use disorder, moderate, dependence (HCC)   Cocaine use disorder, moderate, dependence (HCC)   Sedative, hypnotic or anxiolytic use disorder, mild, abuse  Long Term Goal(s): Improvement in symptoms so as ready for discharge Improvement in symptoms so as ready for discharge   Short Term Goals: Ability to identify changes in lifestyle to reduce recurrence of condition will improve Ability to verbalize feelings will improve Ability to disclose and discuss suicidal ideas Ability to demonstrate self-control will improve Ability to identify and develop effective coping behaviors will improve Ability to maintain clinical measurements within normal limits will improve Compliance with prescribed medications will improve Ability to identify changes in lifestyle to reduce recurrence of condition will improve Ability to demonstrate self-control will improve Ability to identify triggers associated with substance abuse/mental health issues will improve     Medication Management: Evaluate patient's response, side effects, and tolerance of medication regimen.  Therapeutic Interventions: 1 to 1 sessions, Unit Group sessions and Medication administration.  Evaluation of Outcomes: Progressing   RN Treatment Plan for Primary Diagnosis: Bipolar 2 disorder, major depressive episode (HCC) Long Term Goal(s): Knowledge of disease and therapeutic regimen to maintain health will improve  Short Term Goals: Ability to remain free from injury will improve, Ability to verbalize frustration and anger appropriately will improve, Ability to participate in decision making will improve, Ability to disclose and discuss suicidal ideas, Ability to  identify and develop effective coping behaviors will improve and Compliance with prescribed medications will improve  Medication Management: RN will administer medications as ordered by provider, will assess and evaluate patient's response and provide education to patient for prescribed medication. RN will report any adverse and/or side effects to prescribing provider.  Therapeutic Interventions: 1 on 1 counseling sessions, Psychoeducation, Medication administration, Evaluate responses to treatment, Monitor vital signs and CBGs as ordered, Perform/monitor CIWA, COWS, AIMS and Fall Risk screenings as ordered, Perform wound care treatments as ordered.  Evaluation of Outcomes: Progressing   LCSW Treatment Plan for Primary Diagnosis: Bipolar 2 disorder, major depressive episode (HCC) Long Term Goal(s): Safe transition to appropriate next level of care at discharge, Engage patient in therapeutic group addressing interpersonal concerns.  Short Term Goals: Engage patient in aftercare planning with referrals and resources, Increase social support, Facilitate acceptance of mental health diagnosis and concerns, Identify triggers associated with mental health/substance abuse issues and Increase skills for wellness and recovery  Therapeutic Interventions: Assess for all discharge needs, 1 to 1 time with Social worker, Explore available resources and support systems, Assess for adequacy in community support network, Educate family and significant other(s) on suicide prevention, Complete Psychosocial Assessment, Interpersonal group therapy.  Evaluation of Outcomes: Progressing   Progress in Treatment: Attending groups: Yes. Participating in groups: Yes. Taking medication as prescribed: Yes. Toleration medication: Yes. Family/Significant other contact made: Yes, individual(s) contacted:  mother Patient understands diagnosis: Yes. Discussing patient identified problems/goals with staff: Yes. Medical  problems stabilized or resolved: Yes. Denies suicidal/homicidal ideation: Yes. Issues/concerns per patient self-inventory: No.   New problem(s) identified: No, Describe:  None  New Short Term/Long Term Goal(s):  N/A  Discharge Plan or Barriers: {[facility] and will follow up with for medication management, substance abuse treatment and therapy.   Reason for Continuation of Hospitalization: Depression Suicidal ideation  Date of Discharge: 09/13/16  Attendees: Patient: Briana Bootsracie Furney  09/13/2016 11:12 AM  Physician: Kristine LineaJolanta Pucilowska, MD 09/13/2016 11:12 AM  Nursing: Hulan AmatoGwen Farrish, RN 09/13/2016 11:12 AM  RN Care Manager: 09/13/2016 11:12 AM  Social Worker: York GriceJonathan Terez Freimark, MSW, LCSW-A 09/13/2016 11:12 AM  Recreational Therapist: Hershal CoriaBeth Greene, LRT, CTRS 09/13/2016 11:12 AM    Scribe for Treatment Team: Dorothe PeaJonathan F Laraina Sulton, LCSWA 09/13/2016 11:12 AM

## 2016-09-13 NOTE — Progress Notes (Signed)
Pt is calm, cooperative and compliant this shift, spent most of the evening hours socializing in the day room. Pt denies any withdrawal symptoms, with CIWA of zero at MN. Pt is compliant with medications and group. Trazodone given PRN at HS for sleep. Pt denies any SI/HI/VAH, will monitor closely for safety.

## 2016-09-13 NOTE — Progress Notes (Signed)
Pt alert, oriented, up on unit all day today. Interacts appropriately with staff/peers. Denies SI/HI/AVH. Cooperative and medication compliant. No signs of withdrawal, CIWA score 0 at 1600.   Safety maintained with every 15 minute checks. Medications administered as ordered. Support and encouragement provided. Will continue to monitor.

## 2016-09-13 NOTE — Progress Notes (Signed)
Recreation Therapy Notes  Date: 10.12.17 Time: 1:00 pm Location: Craft Room  Group Topic: Leisure Education  Goal Area(s) Addresses:  Patient will identify activities for each letter of the alphabet. Patient will verbalize ability to integrate positive leisure into life post d/c. Patient will verbalize ability to use leisure as a Associate Professorcoping skill.  Behavioral Response: Did not attend  Intervention: Leisure Alphabet  Activity: Patients were given a Leisure Information systems managerAlphabet worksheet and instructed to write healthy leisure activities for each letter of the alphabet.  Education: LRT educated patients on ways they can participate in leisure.  Education Outcome: Patient did not attend group.   Clinical Observations/Feedback: Patient did not attend group.  [name],Lakelyn Straus M, LRT/CTRS 09/13/2016 2:56 PM

## 2016-09-13 NOTE — Discharge Summary (Signed)
Physician Discharge Summary Note  Patient:  Briana Davis is an 25 y.o., female MRN:  [id number] DOB:  10/14/1991 Patient phone:  [phone] (home)  Patient address:   [address] [place] [address],  Total Time spent with patient: 30 minutes  Date of Admission:  09/09/2016 Date of Discharge: 09/13/2016  Reason for Admission:  Suicidal ideation.  Identifying data. Briana Davis is a 24 year old female with a history of bipolar disorder.   Chief complaint. "I was playing by the rules."  History of present illness. The patient has a history of bipolar disorder and substance. She was hospitalized recently at [facility] after aspirin overdose. Ffollowing discharge she continued to work with Dr. Georjean Mode at [facility] who prescribes Prozac and Lamictal. The patient was petitioned by her sister who reported suicidal threats. The patient herself denies having thoughts of hurting herself or others. She admits that she has been using drugs recently and her family was extremely worried. She denies any symptoms of anxiety, depression, or psychosis but is unsure if she will be able to return home to live with her mother. Apparently her sister was trying to reach her by phone on the night of admission and the patient did not want to talk to as she was using. The patient has a history of cutting but did not try to injure herself recently. She claims to be compliant with psychotropic medications. She does report a history of anxiety and hypomanic symptoms including decreased sleep with increased goal-directed behavior and some mild euphoria along with racing thoughts and increased anxiety but no hyperreligious thoughts or hypersexual behavior. She denies any spending sprees or gambling. She does have a history of binge drinking alcohol and would drink 7-8 beers for several nights in a row and then not drink for 2 or 3 weeks.   Past psychiatric history. The patient did have 2 prior inpatient  psychiatric hospitalization in January 2017 at [facility] behavioral health and recently at [facility]. She has been followed at [facility] in the past but has been noncompliant with psychotropic medications and follow-up visits. She does have a history of cutting in the past as well as an overdose. She has had trials of Adderall, Zoloft, Wellbutrin, Depakote and is now on a combination of Prozac and Lamictal but is been noncompliant with medication. She denies any residential rehabilitation and says that she would like to go to AA/NA.  Family psychiatric history. Patient reports that her mother and sister also struggled with panic disorder, depression and anxiety  Substance abuse history. The patient has a history of binge drinking alcohol. She reports that she drinks several times a week, Presley 7 or 8 beers each time. She does have a history of a DUI in the past as well. She tried marijuana but did not like the drug. She denies any cocaine, opioid, or stimulant use. No benzodiazepine abuse that she reports. She does smoke one pack of cigarettes per day and has been smoking since her teens  Social history. The patient was born and raised in [place] by her mom primarily as a 69 years old. She has one sister. She denies any history of any physical or sexual abuse. The patient dropped out of high school in the 11thgrade and is currently working on getting her GED. 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 She does hope to eventually go to college for nursing. She has never been married and has no children and is not currently in  a relationship. The patient has a history of a DUI.   Principal Problem: Bipolar 2 disorder, major depressive episode [facility]) Discharge Diagnoses: Patient Active Problem List   Diagnosis Date Noted  . Tobacco use disorder [F17.200] 09/10/2016  . Cannabis use disorder, moderate, dependence (HCC) [F12.20] 09/10/2016  . Cocaine use disorder, moderate, dependence (HCC) [F14.20]  09/10/2016  . Sedative, hypnotic or anxiolytic use disorder, mild, abuse [F13.10] 09/10/2016  . Drug overdose [T50.901A] 08/03/2016  . Salicylate intoxication [T39.091A] 08/03/2016  . Bipolar 2 disorder, major depressive episode (HCC) [F31.81] 08/03/2016  . Substance induced mood disorder (HCC) [F19.94] 05/05/2016  . Involuntary commitment [Z04.6] 05/05/2016  . 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 Attention deficit hyperactivity disorder (ADHD) [F90.9] 01/01/2016  . Social anxiety disorder [F40.10] 12/15/2015  . 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 Suicidal ideation [R45.851] 12/15/2015    Past Medical History:  Past Medical History:  Diagnosis Date  . Aggression   . Alcohol consumption binge drinking   . Depression   . Diverticulitis   . Kidney stone   . Suicidal ideation     Past Surgical History:  Procedure Laterality Date  . TONSILLECTOMY    . WISDOM TOOTH EXTRACTION     Family History:  Family History  Problem Relation Age of Onset  . Heart disease Mother   . Heart disease Father    Social History:  History  Alcohol Use  . Yes     History  Drug Use  . Types: Cocaine, Marijuana    Social History   Social History  . Marital status: Single    Spouse name: N/A  . Number of children: N/A  . Years of education: N/A   Occupational History  . unemployed    Social History Main Topics  . Smoking status: Current Every Day Smoker    Packs/day: 1.00    Types: Cigarettes  . Smokeless tobacco: Never Used  . Alcohol use Yes  . Drug use:     Types: Cocaine, Marijuana  . Sexual activity: No   Other Topics Concern  . None   Social History Narrative  . None    Hospital Course:    Briana Davis is a 25 year old female with history of bipolar depression and substance abuse admitted for suicidal ideation in the context of relapse on  substances.  1. Suicidal ideation. The patient denies any thoughts, intentions, or plans to hurt herself or others. She is able to contract for safety. She is forward thinking and optimistic about the future.  2. Mood. She has been maintained on a combination of Prozac and Lamictal. We increased Lamictal to 100 mg nightly.  3. Insomnia. Trazodone is available.  4. Smoking. Nicotine patch is available.  5. Alcohol abuse. The patient is an episodic drinker. There was no need for alcohol detox. Vital signs were stable.   6. Substance abuse. She was positive for cocaine, cannabis, benzodiazepine admission. She still minimizes her problems and is not interested in residential substance abuse treatment. Unfortunately, no beds available to the [facility] house.  7. UTI. Urine culture positive for E. Coli. We gave a dose of fosfomycin.   8. STD. Negative test results.   9. Disposition. She was discharged to [facility] house. She will follow up with [facility].    Physical Findings: AIMS: Facial and Oral Movements Muscles of Facial Expression: None, normal Lips and Perioral Area: None, normal  Jaw: None, normal Tongue: None, normal,Extremity Movements Upper (arms, wrists, hands, fingers): None, normal Lower (legs, knees, ankles, toes): None, normal, Trunk Movements Neck, shoulders, hips: None, normal, Overall Severity Severity of abnormal movements (highest score from questions above): None, normal Incapacitation due to abnormal movements: None, normal Patient's awareness of abnormal movements (rate only patient's report): No Awareness, Dental Status Current problems with teeth and/or dentures?: No Does patient usually wear dentures?: No  CIWA:  CIWA-Ar Total: 0 COWS:     Musculoskeletal: Strength & Muscle Tone: within normal limits Gait & Station: normal Patient leans: N/A  Psychiatric Specialty Exam: Physical Exam  Nursing note and vitals reviewed.   Review of Systems   Psychiatric/Behavioral: Positive for substance abuse. The patient is nervous/anxious.     Blood pressure 97/69, pulse 82, temperature 98.8 F (37.1 C), temperature source Oral, resp. rate 18, height 5\' 5"  (1.651 m), weight 65.8 kg (145 lb), last menstrual period 08/09/2016, SpO2 98 %.Body mass index is 24.13 kg/m.  See SRA.                                                  Sleep:  Number of Hours: 8.15     Have you used any form of tobacco in the last 30 days? (Cigarettes, Smokeless Tobacco, Cigars, and/or Pipes): Yes  Has this patient used any form of tobacco in the last 30 days? (Cigarettes, Smokeless Tobacco, Cigars, and/or Pipes) Yes, Yes, A prescription for an FDA-approved tobacco cessation medication was offered at discharge and the patient refused  Blood Alcohol level:  Lab Results  Component Value Date   ETH <5 09/08/2016   ETH 158 (H) 08/03/2016    Metabolic Disorder Labs:  No results found for: HGBA1C, MPG No results found for: PROLACTIN No results found for: CHOL, TRIG, HDL, CHOLHDL, VLDL, LDLCALC  See Psychiatric Specialty Exam and Suicide Risk Assessment completed by Attending Physician prior to discharge.  Discharge destination:  Other:  homeless shelter.  Is patient on multiple antipsychotic therapies at discharge:  No   Has Patient had three or more failed trials of antipsychotic monotherapy by history:  No  Recommended Plan for Multiple Antipsychotic Therapies: NA  Discharge Instructions    Diet - low sodium heart healthy    Complete by:  As directed    Increase activity slowly    Complete by:  As directed        Medication List    STOP taking these medications   nicotine 21 mg/24hr patch Commonly known as:  NICODERM CQ - dosed in mg/24 hours     TAKE these medications     Indication  FLUoxetine 40 MG capsule Commonly known as:  PROZAC Take 1 capsule (40 mg total) by mouth daily.  Indication:  Manic-Depression, Major  Depressive Disorder   lamoTRIgine 100 MG tablet Commonly known as:  LAMICTAL Take 1 tablet (100 mg total) by mouth at bedtime. What changed:  medication strength  how much to take  when to take this  Indication:  Manic-Depression   traZODone 100 MG tablet Commonly known as:  DESYREL Take 1 tablet (100 mg total) by mouth at bedtime as needed for sleep (may repeat x 1 in 1 hour if needed).  Indication:  Trouble Sleeping      Follow-up Information    [facility] .   Specialty:  Behavioral Health Why:  Please arrive to the walk-in clinic between the hours of 9-5pm Monday-Friday for your assessment for medication management, substance abuse treatment and therapy. Contact information: [address] [place] [address] [phone]        [facility] .   Why:  Please arrive on Thursday October 12th, 2017 to be admitted for long-term residential living for substance use Contact information: [place] [address] [place], [place] [address]    Ph: [phone] Fax: DO NOT FAX NOT NEEDED               Follow-up recommendations:  Activity:  as tolerated. Diet:  regular. Other:  keep follow up appointments.  Comments:    Signed: Kristine Linea, MD 09/13/2016, 12:07 PM

## 2016-09-13 NOTE — [facility] Group Notes (Signed)
[facility] LCSW Group Therapy   09/13/2016 9:30 am   Type of Therapy: Group Therapy   Participation Level: Active   Participation Quality: Attentive, Sharing and Supportive   Affect: Appropriate   Cognitive: Alert and Oriented   Insight: Developing/Improving and Engaged   Engagement in Therapy: Developing/Improving and Engaged   Modes of Intervention: Clarification, Confrontation, Discussion, Education, Exploration, Limit-setting, Orientation, Problem-solving, Rapport Building, Dance movement psychotherapisteality Testing, Socialization and Support   Summary of Progress/Problems: The topic for group was balance in life. Today's group focused on defining balance in one's own words, identifying things that can knock one off balance, and exploring healthy ways to maintain balance in life. Group members were asked to provide an example of a time when they felt off balance, describe how they handled that situation, and process healthier ways to regain balance in the future. Group members were asked to share the most important tool for maintaining balance that they [facility] and how they plan to apply this method after discharge. This patient was able to identify the many stressors in her life that contribute to her remaining unbalanced- such as substance use, destructive behaviors that lead to many negative outcomes. It is this patients plan to engage with therapy, group meetings and take medications and seek additional therapy. Nawaf Strange LCSW

## 2016-09-13 NOTE — Progress Notes (Signed)
Provided and reviewed discharge paperwork and prescriptions. Verified understanding by use of teach back method. Denies SI/HI/AVH. Pt belongings returned to include bra and nose rings and clothing from pt's room. Discharged to sister who will transport to [facility] for interview tonight.

## 2016-09-13 NOTE — Plan of Care (Signed)
Problem: Health Behavior/Discharge Planning: Goal: Ability to make decisions will improve Outcome: Progressing Pt is progressing well towards achieving this goal.

## 2016-09-13 NOTE — Plan of Care (Signed)
Problem: Coping: Goal: Ability to demonstrate self-control will improve Outcome: Progressing Interacts appropriately with staff/peers. Attends groups.

## 2016-09-14 NOTE — Progress Notes (Signed)
Recreation Therapy Notes  INPATIENT RECREATION TR PLAN  Patient Details Name: TRAMAINE SNELL MRN: [id number] DOB: 14-Apr-1991 Today's Date: 09/14/2016  Rec Therapy Plan Is patient appropriate for Therapeutic Recreation?: Yes Treatment times per week: At least once a week TR Treatment/Interventions: 1:1 session, Group participation (Comment) (Appropriate participation in daily recreational therapy tx)  Discharge Criteria Pt will be discharged from therapy if:: Treatment goals are met, Discharged Treatment plan/goals/alternatives discussed and agreed upon by:: Patient/family  Discharge Summary Short term goals set: See Care Plan Short term goals met: Complete Progress toward goals comments: One-to-one attended One-to-one attended: Self-esteem, coping skills Reason goals not met: N/A Therapeutic equipment acquired: None Reason patient discharged from therapy: Discharge from hospital Pt/family agrees with progress & goals achieved: Yes Date patient discharged from therapy: 09/13/16   Leonette Monarch, LRT/CTRS 09/14/2016, 12:42 PM

## 2016-10-30 ENCOUNTER — Emergency Department
Admission: EM | Admit: 2016-10-30 | Discharge: 2016-10-30 | Disposition: A | Discharge status: Home / Self Care | Payer: Self-pay | Attending: Emergency Medicine | Admitting: Emergency Medicine

## 2016-10-30 DIAGNOSIS — F902 Attention-deficit hyperactivity disorder, combined type: Secondary | ICD-10-CM | POA: Insufficient documentation

## 2016-10-30 DIAGNOSIS — Z91040 Latex allergy status: Secondary | ICD-10-CM | POA: Insufficient documentation

## 2016-10-30 DIAGNOSIS — F17210 Nicotine dependence, cigarettes, uncomplicated: Secondary | ICD-10-CM | POA: Insufficient documentation

## 2016-10-30 DIAGNOSIS — N3001 Acute cystitis with hematuria: Secondary | ICD-10-CM | POA: Insufficient documentation

## 2016-10-30 DIAGNOSIS — Z79899 Other long term (current) drug therapy: Secondary | ICD-10-CM | POA: Insufficient documentation

## 2016-10-30 LAB — URINALYSIS COMPLETE WITH MICROSCOPIC (ARMC ONLY)
Bilirubin Urine: NEGATIVE
Glucose, UA: NEGATIVE mg/dL
Ketones, ur: NEGATIVE mg/dL
Nitrite: NEGATIVE
Protein, ur: NEGATIVE mg/dL
Specific Gravity, Urine: 1.016 (ref 1.005–1.030)
pH: 5 (ref 5.0–8.0)

## 2016-10-30 LAB — POCT PREGNANCY, URINE: PREG TEST UR: NEGATIVE

## 2016-10-30 MED ORDER — FLUOXETINE HCL 40 MG PO CAPS: 40.0000 mg | ORAL_CAPSULE | Freq: Every day | ORAL | 0 refills | Status: AC

## 2016-10-30 MED ORDER — SULFAMETHOXAZOLE-TRIMETHOPRIM 800-160 MG PO TABS: 1.0000 | ORAL_TABLET | Freq: Two times a day (BID) | ORAL | 0 refills | Status: DC

## 2016-10-30 NOTE — ED Notes (Signed)
Pt reports that she is having sharp pains in the left flank area - pain started 3 days ago - pt has hx of kidney stones - reports urine is dark in color and concentrated - reports urinary frequency with pain and burning with urination - reports urine has a foul odor

## 2016-10-30 NOTE — ED Triage Notes (Signed)
Pt c/o orange colored urine with painful urination for the past 7-10..Marland Kitchen

## 2016-10-30 NOTE — ED Provider Notes (Signed)
[facility] Emergency Department Provider Note  ____________________________________________   First MD Initiated Contact with Patient 10/30/16 1736     (approximate)  I have reviewed the triage vital signs and the nursing notes.   HISTORY  Chief Complaint Urinary Frequency    HPI Briana Davis is a 25 y.o. female presenting with increased urinary frequency and dysuria for the past 2 weeks. Patient has a past medical history pertinent for UTIs, depression with suicidal ideation, and nephrolithiasis. Patient denies fever. She has had left-sided back pain. Denies nausea and vomiting. She states that her symptoms are consistent with prior instances of urinary tract infection. She has not attempted alleviating measures. She denies being sexually active or increased vaginal discharge.   Past Medical History:  Diagnosis Date  . Aggression   . Alcohol consumption binge drinking   . Depression   . Diverticulitis   . Kidney stone   . Suicidal ideation     Patient Active Problem List   Diagnosis Date Noted  . Tobacco use disorder 09/10/2016  . Cannabis use disorder, moderate, dependence (HCC) 09/10/2016  . Cocaine use disorder, moderate, dependence (HCC) 09/10/2016  . Sedative, hypnotic or anxiolytic use disorder, mild, abuse 09/10/2016  . Drug overdose 08/03/2016  . Salicylate intoxication 08/03/2016  . Bipolar 2 disorder, major depressive episode (HCC) 08/03/2016  . Substance induced mood disorder (HCC) 05/05/2016  . Involuntary commitment 05/05/2016  . Attention deficit hyperactivity disorder (ADHD), combined type   . Alcohol dependence, binge pattern (HCC) 01/01/2016  . Attention deficit hyperactivity disorder (ADHD) 01/01/2016  . Social anxiety disorder 12/15/2015  . Mild alcohol abuse in early remission 12/15/2015  . Suicidal ideation 12/15/2015    Past Surgical History:  Procedure Laterality Date  . TONSILLECTOMY    . WISDOM TOOTH  EXTRACTION      Prior to Admission medications   Medication Sig Start Date End Date Taking? Authorizing Provider  FLUoxetine (PROZAC) 40 MG capsule Take 1 capsule (40 mg total) by mouth daily. 10/30/16 10/30/17  Orvil FeilJaclyn M Anjanae Woehrle, PA-C  lamoTRIgine (LAMICTAL) 100 MG tablet Take 1 tablet (100 mg total) by mouth at bedtime. 09/12/16   Shari ProwsJolanta B Pucilowska, MD  sulfamethoxazole-trimethoprim (BACTRIM DS,SEPTRA DS) 800-160 MG tablet Take 1 tablet by mouth 2 (two) times daily. 10/30/16   Orvil FeilJaclyn M Brynlea Spindler, PA-C  traZODone (DESYREL) 100 MG tablet Take 1 tablet (100 mg total) by mouth at bedtime as needed for sleep (may repeat x 1 in 1 hour if needed). 09/12/16   Shari ProwsJolanta B Pucilowska, MD    Allergies Latex and Amoxicillin  Family History  Problem Relation Age of Onset  . Heart disease Mother   . Heart disease Father     Social History Social History  Substance Use Topics  . Smoking status: Current Every Day Smoker    Packs/day: 1.00    Types: Cigarettes  . Smokeless tobacco: Never Used  . Alcohol use Yes    Review of Systems Constitutional: No fever/chills ENT: No sore throat. Cardiovascular: Denies chest pain. Respiratory: Denies shortness of breath. Gastrointestinal: No abdominal pain.  No nausea, no vomiting.  No diarrhea.  No constipation. Genitourinary: Has dysuria and increased urinary frequency. Musculoskeletal: Has left sided back pain. Skin: Negative for rash. Neurological: Negative for headaches, focal weakness or numbness.  10-point ROS otherwise negative.  ____________________________________________   PHYSICAL EXAM:  VITAL SIGNS: ED Triage Vitals  Enc Vitals Group     BP 10/30/16 1739 (!) 130/92  Pulse Rate 10/30/16 1739 (!) 129     Resp 10/30/16 1739 16     Temp --      Temp src --      SpO2 10/30/16 1739 98 %     Weight 10/30/16 1727 145 lb (65.8 kg)     Height 10/30/16 1727 5\' 5"  (1.651 m)     Head Circumference --      Peak Flow --      Pain Score  10/30/16 1727 4     Pain Loc --      Pain Edu? --      Excl. in GC? --     Constitutional: Alert and oriented. Well appearing and in no acute distress. Eyes: Conjunctivae are normal. PERRL. EOMI. Head: Atraumatic. Cardiovascular: Normal rate, regular rhythm. Grossly normal heart sounds.  Good peripheral circulation. Respiratory: Normal respiratory effort.  No retractions. Lungs CTAB. Gastrointestinal: Soft and nontender. No distention. No abdominal bruits. CVA tenderness on left. Genitourinary: No suprapubic discomfort. Musculoskeletal: No lower extremity tenderness nor edema.  No joint effusions. Neurologic:  Normal speech and language. No gross focal neurologic deficits are appreciated. No gait instability. Skin:  Skin is warm, dry and intact. No rash noted. Psychiatric: Mood and affect are normal. Speech and behavior are normal.  ____________________________________________   LABS (all labs ordered are listed, but only abnormal results are displayed)  Labs Reviewed  URINALYSIS COMPLETEWITH MICROSCOPIC ([facility] ONLY) - Abnormal; Notable for the following:       Result Value   Color, Urine AMBER (*)    APPearance CLEAR (*)    Hgb urine dipstick 2+ (*)    Leukocytes, UA 1+ (*)    Bacteria, UA RARE (*)    Squamous Epithelial / LPF 6-30 (*)    All other components within normal limits  URINE CULTURE  POCT PREGNANCY, URINE   ____________________________________________  Labs: Urinalysis: Leukocytes and blood. Negative for nitrites.  POCT Glucose:    Procedures: None   INITIAL IMPRESSION / ASSESSMENT AND PLAN / ED COURSE  Pertinent labs & imaging results that were available during my care of the patient were reviewed by me and considered in my medical decision making (see chart for details).   Clinical Course    Assessment and plan: Patient presents with increased urinary frequency and dysuria for the past 2 weeks. Urinalysis indicates evidence of blood and  leukocytes without nitrites. Patient was initially tachycardic on physical exam but afebrile. Vtal signs were reassessed at normal sinus rhythm. Patient was treated with Bactrim. She was advised to follow-up with her primary care provider in one week if symptoms of dysuria and increased urinary frequency do not resolve. Patient requested a refill of her Prozac as she is completely out and will not be able to establish care with a psychiatric provider for a month. Patient was prescribed her regimen dose of 40 mg of Prozac once daily. Strict return precautions were given. All patient questions were answered.  ____________________________________________   FINAL CLINICAL IMPRESSION(S) / ED DIAGNOSES  Final diagnoses:  Acute cystitis with hematuria      NEW MEDICATIONS STARTED DURING THIS VISIT:  New Prescriptions   FLUOXETINE (PROZAC) 40 MG CAPSULE    Take 1 capsule (40 mg total) by mouth daily.   SULFAMETHOXAZOLE-TRIMETHOPRIM (BACTRIM DS,SEPTRA DS) 800-160 MG TABLET    Take 1 tablet by mouth 2 (two) times daily.     Note:  This document was prepared using Dragon voice recognition software and may include unintentional  dictation errors.    Orvil FeilJaclyn M Verbie Babic, PA-C 10/30/16 1933    Rockne MenghiniAnne-Caroline Norman, MD 10/30/16 2249

## 2016-11-02 LAB — URINE CULTURE: Culture: >=100000 — AB

## 2016-12-16 ENCOUNTER — Emergency Department
Admission: EM | Admit: 2016-12-16 | Discharge: 2016-12-16 | Disposition: A | Discharge status: Home / Self Care | Payer: Self-pay | Attending: Emergency Medicine | Admitting: Emergency Medicine

## 2016-12-16 ENCOUNTER — Encounter: Payer: Self-pay | Admitting: *Deleted

## 2016-12-16 DIAGNOSIS — F1910 Other psychoactive substance abuse, uncomplicated: Secondary | ICD-10-CM | POA: Insufficient documentation

## 2016-12-16 DIAGNOSIS — Z79899 Other long term (current) drug therapy: Secondary | ICD-10-CM | POA: Insufficient documentation

## 2016-12-16 DIAGNOSIS — F10120 Alcohol abuse with intoxication, uncomplicated: Secondary | ICD-10-CM | POA: Insufficient documentation

## 2016-12-16 DIAGNOSIS — F17210 Nicotine dependence, cigarettes, uncomplicated: Secondary | ICD-10-CM | POA: Insufficient documentation

## 2016-12-16 DIAGNOSIS — F909 Attention-deficit hyperactivity disorder, unspecified type: Secondary | ICD-10-CM | POA: Insufficient documentation

## 2016-12-16 LAB — COMPREHENSIVE METABOLIC PANEL
ALBUMIN: 4.6 g/dL (ref 3.5–5.0)
ALT: 43 U/L (ref 14–54)
ANION GAP: 10 (ref 5–15)
AST: 27 U/L (ref 15–41)
Alkaline Phosphatase: 75 U/L (ref 38–126)
BILIRUBIN TOTAL: 0.6 mg/dL (ref 0.3–1.2)
BUN: 6 mg/dL (ref 6–20)
CHLORIDE: 106 mmol/L (ref 101–111)
CO2: 29 mmol/L (ref 22–32)
Calcium: 9.8 mg/dL (ref 8.9–10.3)
Creatinine, Ser: 0.52 mg/dL (ref 0.44–1.00)
GFR calc Af Amer: >60 mL/min (ref >60)
Glucose, Bld: 117 mg/dL — ABNORMAL HIGH (ref 65–99)
POTASSIUM: 4.6 mmol/L (ref 3.5–5.1)
Sodium: 145 mmol/L (ref 135–145)
TOTAL PROTEIN: 8.3 g/dL — AB (ref 6.5–8.1)

## 2016-12-16 LAB — URINE DRUG SCREEN, QUALITATIVE (ARMC ONLY)
AMPHETAMINES, UR SCREEN: NOT DETECTED
BENZODIAZEPINE, UR SCRN: POSITIVE — AB
Barbiturates, Ur Screen: NOT DETECTED
Cannabinoid 50 Ng, Ur ~~LOC~~: POSITIVE — AB
Cocaine Metabolite,Ur ~~LOC~~: POSITIVE — AB
MDMA (ECSTASY) UR SCREEN: NOT DETECTED
METHADONE SCREEN, URINE: NOT DETECTED
Opiate, Ur Screen: NOT DETECTED
Phencyclidine (PCP) Ur S: NOT DETECTED
TRICYCLIC, UR SCREEN: NOT DETECTED

## 2016-12-16 LAB — CBC
HCT: 45 % (ref 35.0–47.0)
Hemoglobin: 15.3 g/dL (ref 12.0–16.0)
MCH: 31.2 pg (ref 26.0–34.0)
MCHC: 33.9 g/dL (ref 32.0–36.0)
MCV: 92.1 fL (ref 80.0–100.0)
PLATELETS: 542 10*3/uL — AB (ref 150–440)
RBC: 4.89 MIL/uL (ref 3.80–5.20)
RDW: 13.9 % (ref 11.5–14.5)
WBC: 12.3 10*3/uL — AB (ref 3.6–11.0)

## 2016-12-16 LAB — SALICYLATE LEVEL: Salicylate Lvl: <7 mg/dL (ref 2.8–30.0)

## 2016-12-16 LAB — POCT PREGNANCY, URINE: PREG TEST UR: NEGATIVE

## 2016-12-16 LAB — ACETAMINOPHEN LEVEL: Acetaminophen (Tylenol), Serum: <10 ug/mL — ABNORMAL LOW (ref 10–30)

## 2016-12-16 LAB — ETHANOL: ALCOHOL ETHYL (B): 200 mg/dL — AB (ref <5)

## 2016-12-16 NOTE — ED Triage Notes (Addendum)
Pt arrived to ED via [place] [employer] after papers were taken out by pts sister after pt verbalized suicidal thoughts. Pt stated she did not want to live anymore. Pt in triage denies SI? HI but also verbalized "you guys can do whatever you want to me, I don't care anymore". When asked about drug use pt responded "you guys will find out when you test me right?" Pt verbalized she has ETOH on board today. Pt denies visual or auditory hallucination. PD verbalized that pts sister stated pt is in the process of going to rehab for alcohol.

## 2016-12-16 NOTE — ED Notes (Signed)
Pt given phone and waiting on ride. Pt states her sister is coming to pick her up. Pt given belongings and is able to change clothing. Pt waiting for sister then nurse to escort pt to lobby.

## 2016-12-16 NOTE — ED Notes (Signed)
FIRST NURSE NOTE: [place] [employer] with patient and IVC paperwork.

## 2016-12-16 NOTE — ED Provider Notes (Signed)
[facility] Emergency Department Provider Note        Time seen: ----------------------------------------- 3:21 PM on 12/16/2016 -----------------------------------------    I have reviewed the triage vital signs and the nursing notes.   HISTORY  Chief Complaint Suicidal    HPI Briana Davis is a 26 y.o. female who presents to the ER for involuntary commitment. Paperwork was filled out by her sister who states she was verbalizing suicidal thoughts. Patient stated she did not want to live anymore. She reports she drank all day yesterday and was planning on drinking today and watching football games. She reportedly is scheduled to be evaluated for long-term alcohol detox tomorrow.   Past Medical History:  Diagnosis Date  . Aggression   . Alcohol consumption binge drinking   . Depression   . Diverticulitis   . Kidney stone   . Suicidal ideation     Patient Active Problem List   Diagnosis Date Noted  . Tobacco use disorder 09/10/2016  . Cannabis use disorder, moderate, dependence (HCC) 09/10/2016  . Cocaine use disorder, moderate, dependence (HCC) 09/10/2016  . Sedative, hypnotic or anxiolytic use disorder, mild, abuse 09/10/2016  . Drug overdose 08/03/2016  . Salicylate intoxication 08/03/2016  . Bipolar 2 disorder, major depressive episode (HCC) 08/03/2016  . Substance induced mood disorder (HCC) 05/05/2016  . Involuntary commitment 05/05/2016  . Attention deficit hyperactivity disorder (ADHD), combined type   . Alcohol dependence, binge pattern (HCC) 01/01/2016  . Attention deficit hyperactivity disorder (ADHD) 01/01/2016  . Social anxiety disorder 12/15/2015  . Mild alcohol abuse in early remission 12/15/2015  . Suicidal ideation 12/15/2015    Past Surgical History:  Procedure Laterality Date  . TONSILLECTOMY    . WISDOM TOOTH EXTRACTION      Allergies Latex and Amoxicillin  Social History Social History  Substance Use Topics   . Smoking status: Current Every Day Smoker    Packs/day: 1.00    Types: Cigarettes  . Smokeless tobacco: Never Used  . Alcohol use Yes    Review of Systems Constitutional: Negative for fever. Cardiovascular: Negative for chest pain. Respiratory: Negative for shortness of breath. Gastrointestinal: Negative for abdominal pain, vomiting and diarrhea. Genitourinary: Negative for dysuria. Musculoskeletal: Negative for back pain. Skin: Negative for rash. Neurological: Negative for headaches, focal weakness or numbness.  Psychiatric: Negative for suicidal or homicidal ideation, positive for alcohol abuse  10-point ROS otherwise negative.  ____________________________________________   PHYSICAL EXAM:  VITAL SIGNS: ED Triage Vitals [12/16/16 1434]  Enc Vitals Group     BP      Pulse      Resp      Temp      Temp src      SpO2      Weight 142 lb (64.4 kg)     Height 5\' 5"  (1.651 m)     Head Circumference      Peak Flow      Pain Score      Pain Loc      Pain Edu?      Excl. in GC?    Constitutional: Alert and oriented. Well appearing and in no distress. Eyes: Conjunctivae are normal. PERRL. Normal extraocular movements. ENT   Head: Normocephalic and atraumatic.   Nose: No congestion/rhinnorhea.   Mouth/Throat: Mucous membranes are moist.   Neck: No stridor. Cardiovascular: Normal rate, regular rhythm. No murmurs, rubs, or gallops. Respiratory: Normal respiratory effort without tachypnea nor retractions. Breath sounds are clear and equal bilaterally. No  wheezes/rales/rhonchi. Gastrointestinal: Soft and nontender. Normal bowel sounds Musculoskeletal: Nontender with normal range of motion in all extremities. No lower extremity tenderness nor edema. Neurologic:  Normal speech and language. No gross focal neurologic deficits are appreciated.  Skin:  Skin is warm, dry and intact. No rash noted. Psychiatric: Mood and affect are normal. Speech and behavior are  normal.  ____________________________________________  ED COURSE:  Pertinent labs & imaging results that were available during my care of the patient were reviewed by me and considered in my medical decision making (see chart for details). Clinical Course   Patient presents to the ER in no distress, we will assess with labs and consult psychiatry.  Procedures ____________________________________________   LABS (pertinent positives/negatives)  Labs Reviewed  COMPREHENSIVE METABOLIC PANEL - Abnormal; Notable for the following:       Result Value   Glucose, Bld 117 (*)    Total Protein 8.3 (*)    All other components within normal limits  ETHANOL - Abnormal; Notable for the following:    Alcohol, Ethyl (B) 200 (*)    All other components within normal limits  ACETAMINOPHEN LEVEL - Abnormal; Notable for the following:    Acetaminophen (Tylenol), Serum <10 (*)    All other components within normal limits  CBC - Abnormal; Notable for the following:    WBC 12.3 (*)    Platelets 542 (*)    All other components within normal limits  URINE DRUG SCREEN, QUALITATIVE ([facility] ONLY) - Abnormal; Notable for the following:    Cocaine Metabolite,Ur [place] POSITIVE (*)    Cannabinoid 50 Ng, Ur  POSITIVE (*)    Benzodiazepine, Ur Scrn POSITIVE (*)    All other components within normal limits  SALICYLATE LEVEL  POCT PREGNANCY, URINE  ____________________________________________  FINAL ASSESSMENT AND PLAN  Alcohol intoxication, alcohol use disorder, Polysubstance abuse  Plan: Patient with labs as dictated above. Patient presents to the ER for possible suicidal ideation and intoxication. She is intoxicated but she is of sound mind and able to ambulate without any difficulty. She has a known substance abuse history and disorder. She is reportedly to follow-up tomorrow for detox.   Emily FilbertWilliams, Yordin Rhoda E, MD   Note: This dictation was prepared with Dragon dictation. Any transcriptional errors  that result from this process are unintentional    Emily FilbertJonathan E Khylan Sawyer, MD 12/16/16 1610

## 2017-02-12 IMAGING — CT CT RENAL STONE PROTOCOL
3 of 4 series · 9 of 46 positions shown, 16 images · non-contrast
Comparison: CT dated 09/26/2012

CLINICAL DATA: 24-year-old female with right flank pain. Burning on
urination.

EXAM:
CT ABDOMEN AND PELVIS WITHOUT CONTRAST
TECHNIQUE: Multidetector CT imaging of the abdomen and pelvis was performed
following the standard protocol without IV contrast.

[Series 3: mpr coronal 3.0mm · coronal · 0.91mm/px · 3 of 68 slices shown, 4 images]
[im 23/68  soft-tissue]
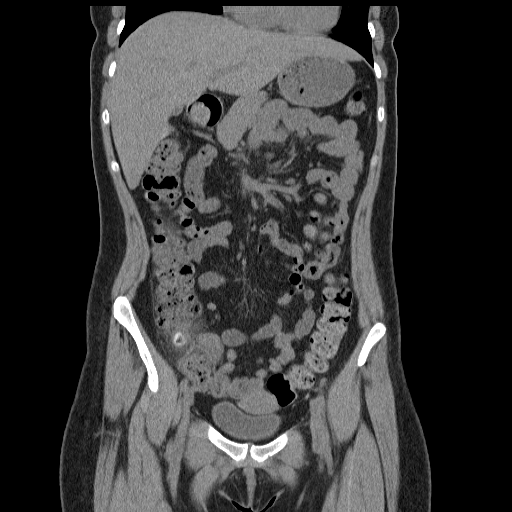
[im 30/68  soft-tissue]
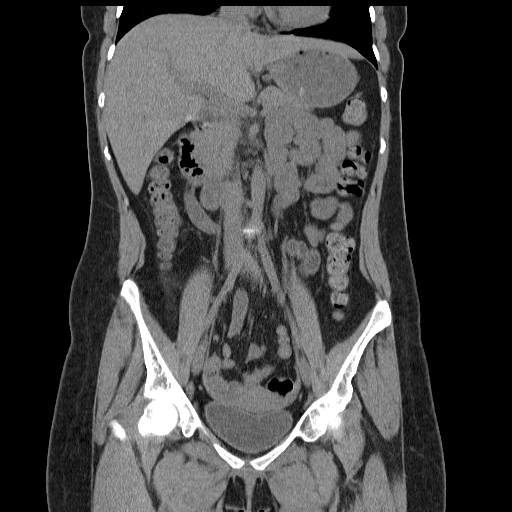
[im 30/68  bone]
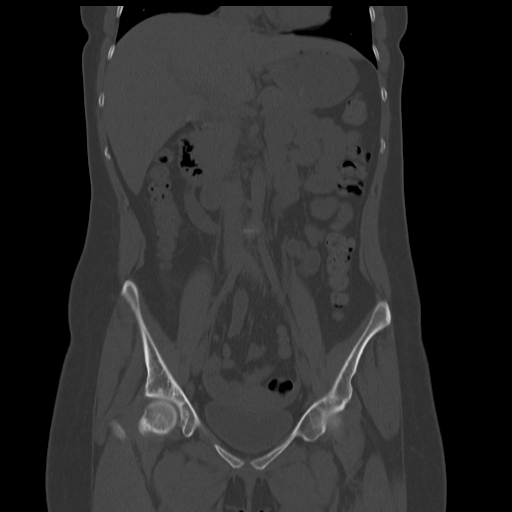
[im 38/68  soft-tissue]
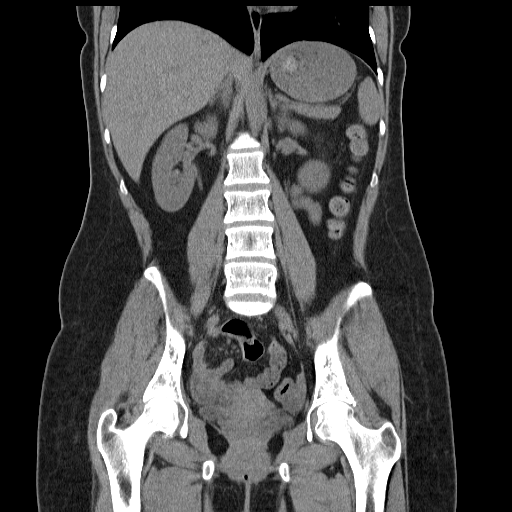

[Series 4: mpr sagittal 3.0mm · sagittal · 0.49mm/px · 1 of 110 slices shown, 2 images]
[im 37/110  soft-tissue]
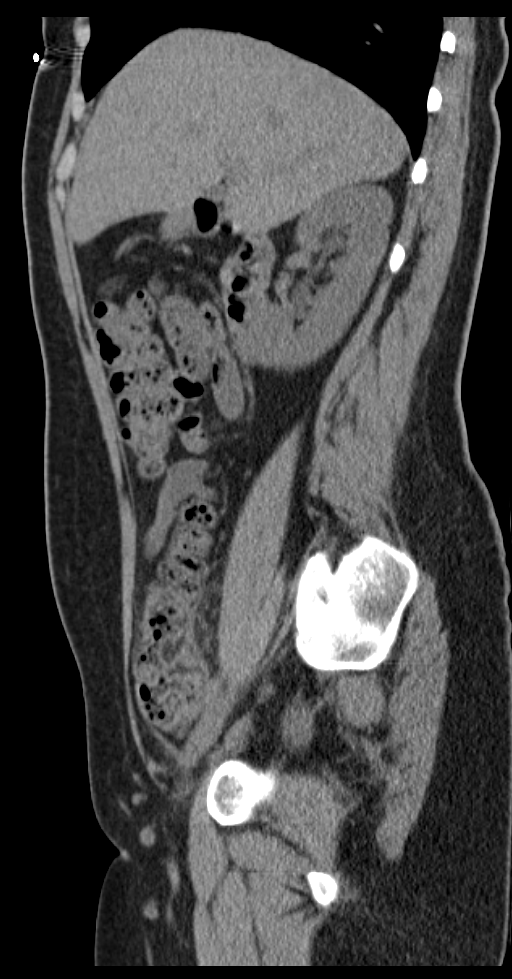
[im 37/110  bone]
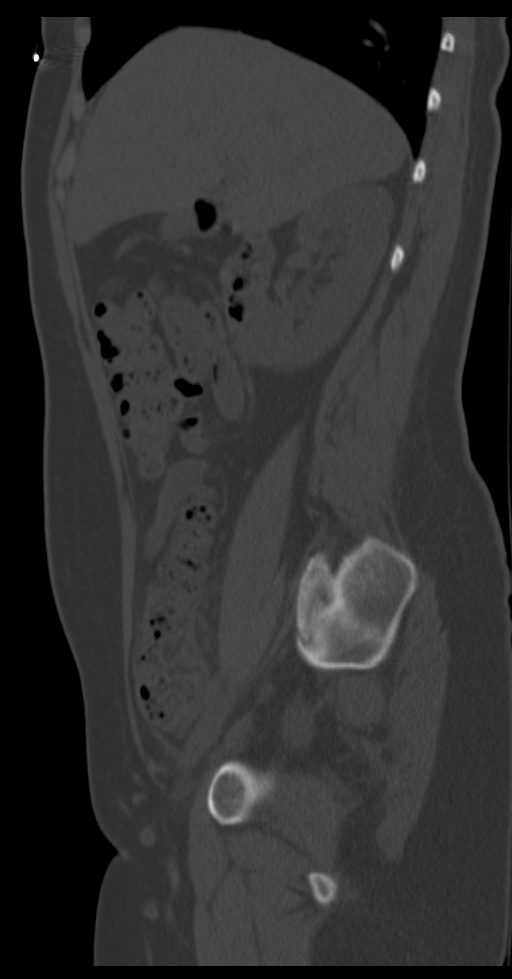

[Series 6: lung 5.0 b60f · axial · 0.66mm/px · z∈[-102,-22]mm · 5 of 25 slices shown, 10 images]
[im 5/25  soft-tissue]
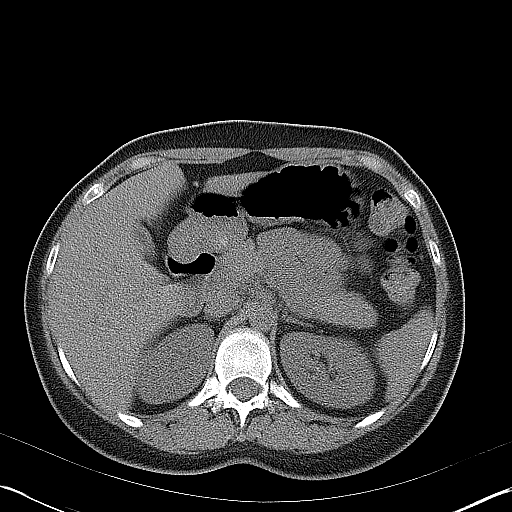
[im 5/25  bone]
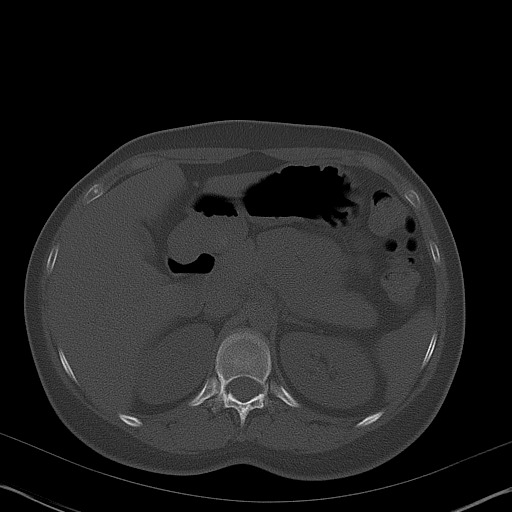
[im 9/25  soft-tissue]
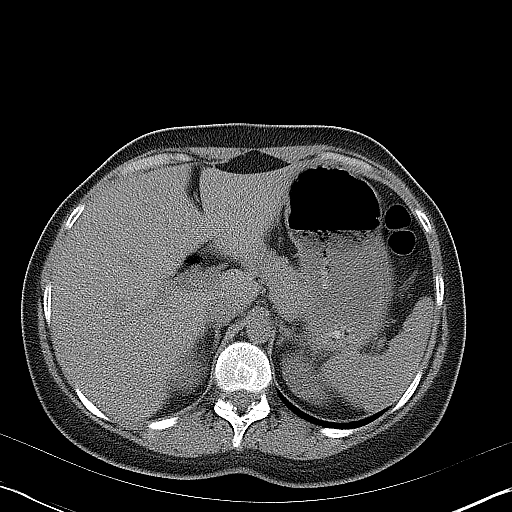
[im 9/25  lung]
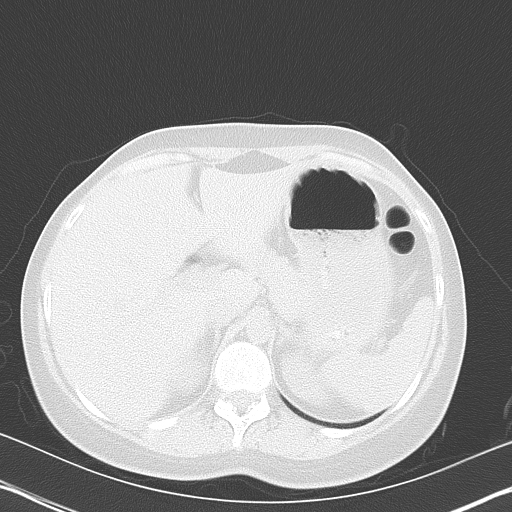
[im 13/25  soft-tissue]
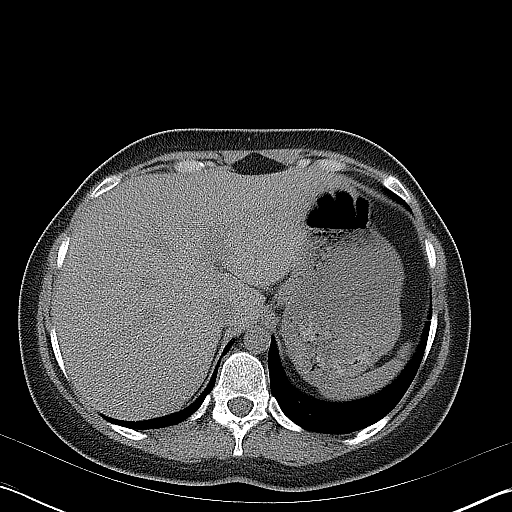
[im 13/25  lung]
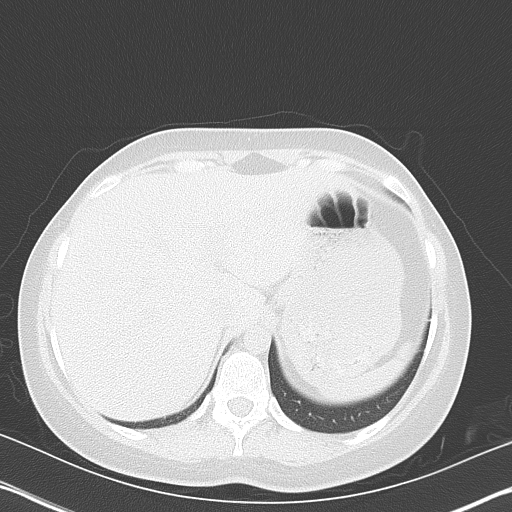
[im 17/25  soft-tissue]
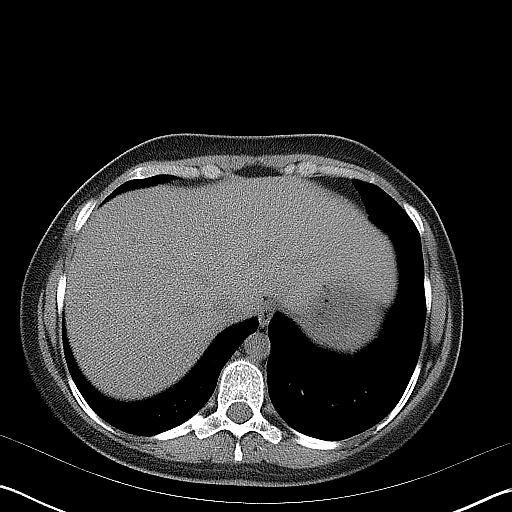
[im 17/25  lung]
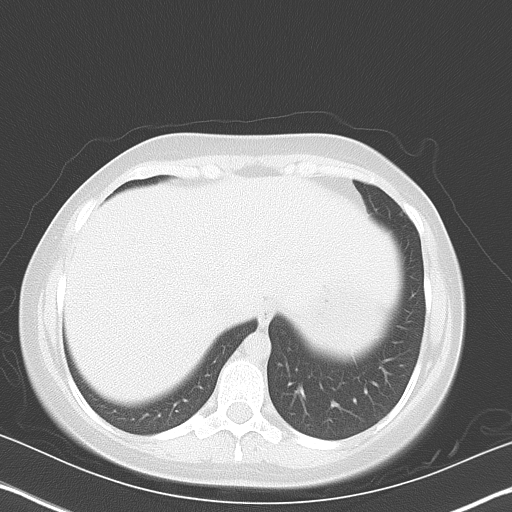
[im 21/25  soft-tissue]
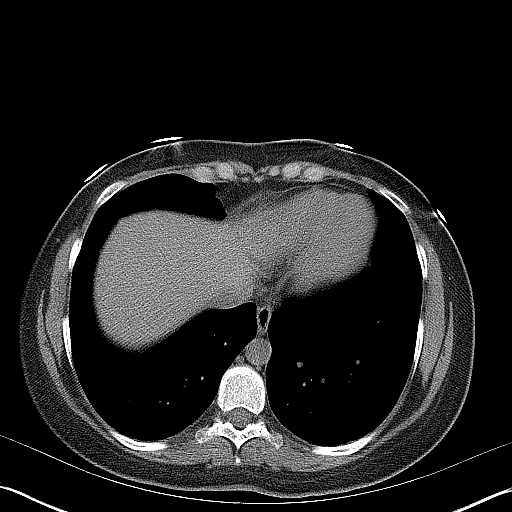
[im 21/25  lung]
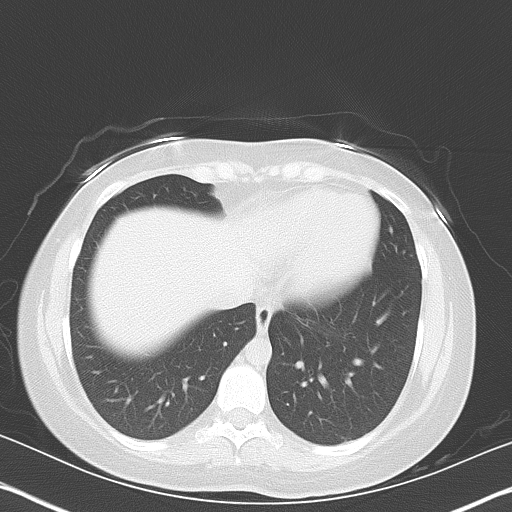

[9 of 46 positions shown; findings below may reference images not displayed]

FINDINGS: Evaluation of this exam is limited in the absence of intravenous
contrast.

The visualized lung bases are clear. No intra-abdominal free air.
Small free fluid within the pelvis.

The liver, gallbladder, pancreas, spleen, and the adrenal glands
appear unremarkable. Multiple small nonobstructing bilateral renal
calculi noted measuring up to 3 mm in the inferior pole of the left
kidney. There is no hydronephrosis on either side. The visualized
ureters and urinary bladder appear unremarkable. The uterus is
anteverted and appears grossly unremarkable. The visualized ovaries
are also unremarkable.

There is moderate stool throughout the colon. There is an inflamed
diverticulum in the posterior wall of the cecum just above the
ileocecal valve. No evidence of perforation or abscess. There is no
evidence of bowel obstruction. Normal appendix.

The abdominal aorta and IVC appear grossly unremarkable on this
noncontrast study. No portal venous gas identified. There is no
adenopathy. The chest wall soft tissues appear unremarkable. The
osseous structures are intact.
IMPRESSION: Diverticulitis of the cecum.  No abscess.

Small nonobstructing bilateral renal calculi.  No hydronephrosis.

## 2023-08-23 ENCOUNTER — Emergency Department: Payer: BC Managed Care – PPO

## 2023-08-23 ENCOUNTER — Emergency Department
Admission: EM | Admit: 2023-08-23 | Discharge: 2023-08-23 | Disposition: A | Discharge status: Home / Self Care | Payer: BC Managed Care – PPO | Attending: Emergency Medicine | Admitting: Emergency Medicine

## 2023-08-23 ENCOUNTER — Other Ambulatory Visit: Payer: Self-pay

## 2023-08-23 DIAGNOSIS — D72829 Elevated white blood cell count, unspecified: Secondary | ICD-10-CM | POA: Insufficient documentation

## 2023-08-23 DIAGNOSIS — R109 Unspecified abdominal pain: Secondary | ICD-10-CM | POA: Diagnosis present

## 2023-08-23 DIAGNOSIS — N200 Calculus of kidney: Secondary | ICD-10-CM | POA: Diagnosis not present

## 2023-08-23 LAB — URINALYSIS, ROUTINE W REFLEX MICROSCOPIC
Bilirubin Urine: NEGATIVE
Glucose, UA: NEGATIVE mg/dL
Ketones, ur: NEGATIVE mg/dL
Nitrite: NEGATIVE
Protein, ur: NEGATIVE mg/dL
Specific Gravity, Urine: 1.017 (ref 1.005–1.030)
pH: 5 (ref 5.0–8.0)

## 2023-08-23 LAB — BASIC METABOLIC PANEL
Anion gap: 10 (ref 5–15)
BUN: 16 mg/dL (ref 6–20)
CO2: 25 mmol/L (ref 22–32)
Calcium: 9.3 mg/dL (ref 8.9–10.3)
Chloride: 102 mmol/L (ref 98–111)
Creatinine, Ser: 0.63 mg/dL (ref 0.44–1.00)
GFR, Estimated: >60 mL/min (ref >60)
Glucose, Bld: 121 mg/dL — ABNORMAL HIGH (ref 70–99)
Potassium: 3.7 mmol/L (ref 3.5–5.1)
Sodium: 137 mmol/L (ref 135–145)

## 2023-08-23 LAB — CBC
HCT: 38.4 % (ref 36.0–46.0)
Hemoglobin: 12.7 g/dL (ref 12.0–15.0)
MCH: 29.8 pg (ref 26.0–34.0)
MCHC: 33.1 g/dL (ref 30.0–36.0)
MCV: 90.1 fL (ref 80.0–100.0)
Platelets: 469 10*3/uL — ABNORMAL HIGH (ref 150–400)
RBC: 4.26 MIL/uL (ref 3.87–5.11)
RDW: 12.1 % (ref 11.5–15.5)
WBC: 11.6 10*3/uL — ABNORMAL HIGH (ref 4.0–10.5)
nRBC: 0 % (ref 0.0–0.2)

## 2023-08-23 LAB — POC URINE PREG, ED: Preg Test, Ur: NEGATIVE

## 2023-08-23 MED ORDER — TAMSULOSIN HCL 0.4 MG PO CAPS: 0.4000 mg | ORAL_CAPSULE | Freq: Every day | ORAL | 1 refills | Status: AC

## 2023-08-23 MED ORDER — SULFAMETHOXAZOLE-TRIMETHOPRIM 800-160 MG PO TABS: 1.0000 | ORAL_TABLET | Freq: Two times a day (BID) | ORAL | 0 refills | Status: AC

## 2023-08-23 NOTE — Discharge Instructions (Addendum)
You were evaluated in the ED for left-sided abdominal pain.  Your labs are reassuring.  Your urinalysis revealed some bacteria in your urine which will be treated with antibiotics.  Your CT renal stone study revealed 1-7mm kidney stone in your kidneys.  This can be flushed out with oral hydration.  Please review education packet on kidney stones attached.  Follow-up with your primary care as needed.  Take Tylenol and ibuprofen for pain.  Follow-up with urology as needed.

## 2023-08-23 NOTE — ED Provider Notes (Signed)
[facility] Emergency Department Provider Note     Event Date/Time   First MD Initiated Contact with Patient 08/23/23 1704     (approximate)   History   Flank Pain   HPI  ZAYNE PICO is a 32 y.o. female with a history of kidney stones presents to the ED with complaint of left-sided flank pain x 4 days.  Patient reports similar symptoms to past kidney stones.  Associated symptoms includes dysuria and urinary frequency that began this morning.  Denies fever.  Denies hematuria, vomiting, and nausea.     Physical Exam   Triage Vital Signs: ED Triage Vitals  Encounter Vitals Group     BP 08/23/23 1657 (!) 157/90     Systolic BP Percentile --      Diastolic BP Percentile --      Pulse Rate 08/23/23 1657 (!) 102     Resp 08/23/23 1657 18     Temp 08/23/23 1657 98 F (36.7 C)     Temp src --      SpO2 08/23/23 1657 98 %     Weight --      Height --      Head Circumference --      Peak Flow --      Pain Score 08/23/23 1656 7     Pain Loc --      Pain Education --      Exclude from Growth Chart --     Most recent vital signs: Vitals:   08/23/23 1657  BP: (!) 157/90  Pulse: (!) 102  Resp: 18  Temp: 98 F (36.7 C)  SpO2: 98%    General: Well appearing. Alert and oriented. INAD.  Nontoxic  Head:  NCAT.  CV:  Good peripheral perfusion. RRR.  RESP:  Normal effort. LCTAB. No retractions.  ABD:  No distention. Soft.  Left-sided tenderness to palpation of lumbar region. No masses or organomegaly. No CVA tenderness bilaterally.  BACK:  Spinous process is midline without deformity or tenderness. MSK:   Full ROM in all joints. No swelling, deformity or tenderness.  NEURO: Cranial nerves intact. No focal deficits. Sensation and motor function intact. 5/5 muscle strength of UE & LE. Gait is steady.   ED Results / Procedures / Treatments   Labs (all labs ordered are listed, but only abnormal results are displayed) Labs Reviewed  URINALYSIS,  ROUTINE W REFLEX MICROSCOPIC - Abnormal; Notable for the following components:      Result Value   Color, Urine YELLOW (*)    APPearance HAZY (*)    Hgb urine dipstick MODERATE (*)    Leukocytes,Ua SMALL (*)    Bacteria, UA RARE (*)    All other components within normal limits  BASIC METABOLIC PANEL - Abnormal; Notable for the following components:   Glucose, Bld 121 (*)    All other components within normal limits  CBC - Abnormal; Notable for the following components:   WBC 11.6 (*)    Platelets 469 (*)    All other components within normal limits  POC URINE PREG, ED   RADIOLOGY  I personally viewed and evaluated these images as part of my medical decision making, as well as reviewing the written report by the radiologist.  ED Provider Interpretation: CT renal stone study shows bilateral stones in the kidney and more on the left side possible ureteral area.  Pending final radiology read.  CT Renal Stone Study  Result Date: 08/23/2023 CLINICAL  DATA:  Left-sided flank pain for 4 days. Dysuria. Nephrolithiasis. EXAM: CT ABDOMEN AND PELVIS WITHOUT CONTRAST TECHNIQUE: Multidetector CT imaging of the abdomen and pelvis was performed following the standard protocol without IV contrast. RADIATION DOSE REDUCTION: This exam was performed according to the departmental dose-optimization program which includes automated exposure control, adjustment of the mA and/or kV according to patient size and/or use of iterative reconstruction technique. COMPARISON:  02/12/2016 FINDINGS: Lower chest: No acute findings. Hepatobiliary: No mass visualized on this unenhanced exam. Gallbladder is unremarkable. No evidence of biliary ductal dilatation. Pancreas: No mass or inflammatory process visualized on this unenhanced exam. Spleen:  Within normal limits in size. Adrenals/Urinary tract: A few tiny 1-2 mm renal calculi are seen bilaterally. No evidence of ureteral calculi or hydronephrosis. Unremarkable unopacified  urinary bladder. Stomach/Bowel: No evidence of obstruction, inflammatory process, or abnormal fluid collections. Normal appendix visualized. Vascular/Lymphatic: No pathologically enlarged lymph nodes identified. No evidence of abdominal aortic aneurysm. Reproductive:  No mass or other significant abnormality. Other:  None. Musculoskeletal:  No suspicious bone lesions identified. IMPRESSION: Tiny bilateral renal calculi. No evidence of ureteral calculi, hydronephrosis, or other acute findings. Electronically Signed   By: Danae Orleans M.D.   On: 08/23/2023 18:45    PROCEDURES:  Critical Care performed: No  Procedures  MEDICATIONS ORDERED IN ED: Medications - No data to display   IMPRESSION / MDM / ASSESSMENT AND PLAN / ED COURSE  I reviewed the triage vital signs and the nursing notes.                              Clinical Course as of 08/24/23 0010  Caleen Essex Aug 23, 2023  6045 Opt out of pain medications in ED  [MH]    Clinical Course User Index [MH] Romeo Apple, [place] A, PA-C    32 y.o. female presents to the emergency department for evaluation and treatment of possible kidney stones. See HPI for further details.   Differential diagnosis includes, but is not limited to nephrolithiasis, UTI, pyelonephritis, PID  Patient's presentation is most consistent with acute complicated illness / injury requiring diagnostic workup.  Patient is well-appearing and afebrile.  Physical exam findings are pertinent for left-sided tenderness to palpation of lumbar region.  Sick labs are reassuring with the exception of elevated WBC of 11.6.  Regnancy test is negative.  Urinalysis reveals moderate hemoglobin, leukocytes and bacteria.  Given her history of kidney stones and urinalysis a CT renal study is obtained.  CT renal study shows bilateral renal calculi.  I encouraged patient to continue hydration and UTI will be treated with antibiotics.  Patient opt out of pain medication in the ED.  She is given tamsulosin  if symptoms are to worsen.  She is to follow-up with primary care or urology if symptoms persist.  Patient is in stable condition for discharge home. Patient is given ED precautions to return to the ED for any worsening or new symptoms. Patient verbalizes understanding. All questions and concerns were addressed during ED visit.     FINAL CLINICAL IMPRESSION(S) / ED DIAGNOSES   Final diagnoses:  Flank pain  Kidney stone    Rx / DC Orders   ED Discharge Orders          Ordered    sulfamethoxazole-trimethoprim (BACTRIM DS) 800-160 MG tablet  2 times daily        08/23/23 1938    tamsulosin (FLOMAX) 0.4 MG CAPS capsule  Daily        08/23/23 1938             Note:  This document was prepared using Dragon voice recognition software and may include unintentional dictation errors.    Romeo Apple, Donalee Gaumond A, PA-C 08/24/23 [facility]    Jene Every, MD 08/24/23 1147

## 2023-08-23 NOTE — ED Triage Notes (Signed)
Pt comes with c/o 4 days of left sided flank pain. Pt states some burning and itching with urination and unsure if UTI.
